# Patient Record
Sex: Male | Born: 1970 | Race: Black or African American | Hispanic: No | Marital: Married | Smoking: Never smoker
Health system: Southern US, Community
[De-identification: ages and names within clinical notes are randomized; demographics above are authoritative.]

## PROBLEM LIST (undated history)

## (undated) DIAGNOSIS — S2239XA Fracture of one rib, unspecified side, initial encounter for closed fracture: Secondary | ICD-10-CM

## (undated) DIAGNOSIS — Z8669 Personal history of other diseases of the nervous system and sense organs: Secondary | ICD-10-CM

## (undated) DIAGNOSIS — I10 Essential (primary) hypertension: Secondary | ICD-10-CM

## (undated) HISTORY — DX: Fracture of one rib, unspecified side, initial encounter for closed fracture: S22.39XA

---

## 2009-04-25 ENCOUNTER — Ambulatory Visit: Payer: Self-pay | Admitting: Family Medicine

## 2009-04-25 DIAGNOSIS — G43909 Migraine, unspecified, not intractable, without status migrainosus: Secondary | ICD-10-CM | POA: Insufficient documentation

## 2009-05-12 ENCOUNTER — Ambulatory Visit: Payer: Self-pay | Admitting: Family Medicine

## 2009-05-12 ENCOUNTER — Encounter: Payer: Self-pay | Admitting: Family Medicine

## 2009-05-12 LAB — CONVERTED CEMR LAB
Cholesterol: 165 mg/dL (ref 0–200)
HDL: 45 mg/dL (ref >39)
LDL Cholesterol: 99 mg/dL (ref 0–99)
PSA: 0.79 ng/mL (ref 0.10–4.00)
VLDL: 21 mg/dL (ref 0–40)

## 2009-05-30 ENCOUNTER — Ambulatory Visit: Payer: Self-pay | Admitting: Family Medicine

## 2009-06-19 ENCOUNTER — Ambulatory Visit: Payer: Self-pay | Admitting: Family Medicine

## 2009-06-27 ENCOUNTER — Encounter: Payer: Self-pay | Admitting: Family Medicine

## 2009-10-20 ENCOUNTER — Encounter: Payer: Self-pay | Admitting: Family Medicine

## 2010-01-28 ENCOUNTER — Encounter: Payer: Self-pay | Admitting: Family Medicine

## 2010-01-28 ENCOUNTER — Ambulatory Visit: Payer: Self-pay | Admitting: Family Medicine

## 2010-01-28 DIAGNOSIS — M545 Low back pain: Secondary | ICD-10-CM

## 2010-01-28 DIAGNOSIS — R252 Cramp and spasm: Secondary | ICD-10-CM

## 2010-01-28 LAB — CONVERTED CEMR LAB
Calcium: 10 mg/dL (ref 8.4–10.5)
Chloride: 105 meq/L (ref 96–112)

## 2010-02-04 ENCOUNTER — Emergency Department (HOSPITAL_COMMUNITY)
Admission: EM | Admit: 2010-02-04 | Discharge: 2010-02-04 | Payer: Self-pay | Source: Home / Self Care | Admitting: Emergency Medicine

## 2010-02-18 ENCOUNTER — Ambulatory Visit: Admission: RE | Admit: 2010-02-18 | Discharge: 2010-02-18 | Payer: Self-pay | Source: Home / Self Care

## 2010-02-18 DIAGNOSIS — S2239XA Fracture of one rib, unspecified side, initial encounter for closed fracture: Secondary | ICD-10-CM

## 2010-02-18 HISTORY — DX: Fracture of one rib, unspecified side, initial encounter for closed fracture: S22.39XA

## 2010-02-23 ENCOUNTER — Encounter: Payer: Self-pay | Admitting: Family Medicine

## 2010-02-23 ENCOUNTER — Ambulatory Visit (HOSPITAL_COMMUNITY)
Admission: RE | Admit: 2010-02-23 | Discharge: 2010-02-23 | Payer: Self-pay | Source: Home / Self Care | Attending: Family Medicine | Admitting: Family Medicine

## 2010-02-24 ENCOUNTER — Telehealth: Payer: Self-pay | Admitting: Family Medicine

## 2010-03-19 ENCOUNTER — Ambulatory Visit: Admission: RE | Admit: 2010-03-19 | Discharge: 2010-03-19 | Payer: Self-pay | Source: Home / Self Care

## 2010-03-24 NOTE — Miscellaneous (Signed)
Summary: pharmacy questioning dose  Clinical Lists Changes the health dept pharmacist , Diane, called to question the maintainence dose of propanolol. states 2 tabs 4 times a day is higher than her reference states. she ask that we look at this & call her back [phone].Golden Circle RN  Jun 27, 2009 12:08 PM Discussed dosage w/ pharmacist at [facility] health department. Rx filled.

## 2010-03-24 NOTE — Assessment & Plan Note (Signed)
Summary: FU/KH   Vital Signs:  Patient profile:   40 year old male Weight:      210 pounds Pulse rate:   70 / minute BP sitting:   129 / 88  (left arm)  Vitals Entered By: Arlyss Repress CMA, (June 19, 2009 2:16 PM) CC: f/up migraine headaches. Is Patient Diabetic? No Pain Assessment Patient in pain? no        Primary Care Provider:  Doree Albee MD  CC:  f/up migraine headaches..  History of Present Illness: 40 YOM w/ PMHx/o migraine headaches here  for folowup visit for migraine headaches. Pt recently seen in clinic for acute migraine. Refusing toradol/phenrgan shot ; agreeable to flexeril/benadryl for HA rescue.  Pt reports HA sxs 3-4 days/week w/ symptoms specific for nausea and photophobia w/ pedominant occipital HA. Pt denies aura w/ HA. Pt reports minimal relief w/ flexeril/benadryl. Pt states HAs affecting ability to find work. Pt states that imitrex has nad no effect for breakthrough HA.   Habits & Providers  Alcohol-Tobacco-Diet     Tobacco Status: quit  Allergies: No Known Drug Allergies  Social History: Smoking Status:  quit  Physical Exam  General:  alert.   Head:  normocephalic and atraumatic.   Eyes:  vision grossly intact. + sensitivity to bright light bilaterally Neck:  supple and full ROM, no nuchal rigidity Neurologic:  cranial nerves II-XII intact and strength normal in all extremities.     Impression & Recommendations:  Problem # 1:  MIGRAINE HEADACHE (ICD-346.90) Pt currently meets criteria for chronic migraine  based on Revised International Headache Society criteria for chronic migraine. Plan to begin the pt on prophylactic treatment w/ 1st line therapy w/ propanolol. Plan to reassess effectiveness of propranolol in 4 weeks. Pt educated that treatment will not completely resolve migraines, but rather 50% chance to decrease episodes and intensity over time.  His updated medication list for this problem includes:    Sumatriptan Succinate 50 Mg  Tabs (Sumatriptan succinate) .Marland KitchenMarland KitchenMarland KitchenMarland Kitchen 50 mg by mouth at onset of migraine, may repeat in 2 hours with maximum of 4 tablets in 24 hours for migraine pain    Propranolol Hcl 40 Mg Tabs (Propranolol hcl) .Marland Kitchen... 1 tab twice daily for 7 days, then increase to 2 tabs twice daily for 7 days; then 2 pills 4 times daily thereafter  Orders: [facility]- Est Level  3 (10258)  Complete Medication List: 1)  Sumatriptan Succinate 50 Mg Tabs (Sumatriptan succinate) .... 50 mg by mouth at onset of migraine, may repeat in 2 hours with maximum of 4 tablets in 24 hours for migraine pain 2)  Protonix 40 Mg Tbec (Pantoprazole sodium) .Marland Kitchen.. 1 tab by mouth once daily 3)  Flexeril 10 Mg Tabs (Cyclobenzaprine hcl) .... One by mouth two times a day as needed headache at start to abort 4)  Diphenhydramine Hcl 25 Mg Caps (Diphenhydramine hcl) .... One by mouth q 8 hours as needed headache to sleep it off 5)  Propranolol Hcl 40 Mg Tabs (Propranolol hcl) .Marland Kitchen.. 1 tab twice daily for 7 days, then increase to 2 tabs twice daily for 7 days; then 2 pills 4 times daily thereafter Prescriptions: PROPRANOLOL HCL 40 MG TABS (PROPRANOLOL HCL) 1 tab twice daily for 7 days, then increase to 2 tabs twice daily for 7 days; then 2 pills 4 times daily thereafter  #240 x 1   Entered and Authorized by:   Doree Albee MD   Signed by:   Doree Albee MD on  06/19/2009   Method used:   Faxed to ...       [facility] (retail)       [address] [place], [place]  [address]       Ph: [phone]       Fax: [phone]   RxID:   [id number]    Prevention & Chronic Care Immunizations   Influenza vaccine: Not documented    Tetanus booster: Not documented    Pneumococcal vaccine: Not documented  Other Screening   Smoking status: quit  (06/19/2009)  Lipids   Total Cholesterol: 165  (05/12/2009)   Lipid panel action/deferral: Lipid Panel ordered   LDL: 99  (05/12/2009)   LDL Direct: Not documented   HDL: 45   (05/12/2009)   Triglycerides: 107  (05/12/2009)

## 2010-03-24 NOTE — Assessment & Plan Note (Signed)
Summary: migraines,tcb   Vital Signs:  Patient profile:   40 year old male Height:      70 inches Weight:      214.6 pounds BMI:     30.90 Temp:     98.0 degrees F oral Pulse rate:   63 / minute BP sitting:   131 / 82  (left arm) Cuff size:   large  Vitals Entered By: Tessie Fass CMA (May 30, 2009 9:57 AM) CC: migraines Is Patient Diabetic? No Pain Assessment Patient in pain? yes     Location: head Intensity: 5   Primary Care Provider:  Doree Albee MD  CC:  migraines.  History of Present Illness: CC: migraine x 3 days  3 d h/o occipital HA, worse with laying flat.  No radiation.  Throbbing pain, 5/10.  + photophobia/phonophobia and nausea/vomiting.  Has dx of migraines in past.  Different because longer than normal.  Normally gets 2-3 headaches/wk.  Standing makes him feel better.  On sumatriptan but doesn't really notice improvement with it.  Has taken 2-3 in last 3 days.  + left eye tearing.  Affecting sleep - poor sleep and wakes up with headache.  Ex smoker, occasional wine.  Has tried excedrin, ibuprofen but doesn't really help.  Doesn't drink coffee, sodas.    Habits & Providers  Alcohol-Tobacco-Diet     Tobacco Status: never  Current Medications (verified): 1)  Sumatriptan Succinate 50 Mg Tabs (Sumatriptan Succinate) .... 50 Mg By Mouth At Onset of Migraine, May Repeat in 2 Hours With Maximum of 4 Tablets in 24 Hours For Migraine Pain 2)  Protonix 40 Mg Tbec (Pantoprazole Sodium) .Marland Kitchen.. 1 Tab By Mouth Once Daily  Allergies (verified): No Known Drug Allergies  Social History: Smoking Status:  never  Physical Exam  General:  alert, well-developed, and well-nourished.   Head:  normocephalic and atraumatic.   Eyes:  No corneal or conjunctival inflammation noted. EOMI. Perrla.  Nose:  no external deformity.   Mouth:  good dentition.   Neck:  supple and full ROM.   Extremities:  No clubbing, cyanosis, edema, or deformity noted with normal full range of  motion of all joints.   Neurologic:  Station and gait are normal. Sensory, motor and coordinative functions appear intact.  uvula midline.  CN 2-12 intact.  grimaces to shining light in eyes   Impression & Recommendations:  Problem # 1:  MIGRAINE HEADACHE (ICD-346.90)  toradol/phenergan shot today --> REFUSED.  Start flexeril/benadryl for headache rescue, return to PCP with HA diary and to discuss possilibity of migraine prophylaxis medication (consider amitriptyline).  Discussed lifestyle changes including dietary to help prevent migraines.  His updated medication list for this problem includes:    Sumatriptan Succinate 50 Mg Tabs (Sumatriptan succinate) .Marland KitchenMarland KitchenMarland KitchenMarland Kitchen 50 mg by mouth at onset of migraine, may repeat in 2 hours with maximum of 4 tablets in 24 hours for migraine pain  Orders: [facility]- Est  Level 4 ([address])  Complete Medication List: 1)  Sumatriptan Succinate 50 Mg Tabs (Sumatriptan succinate) .... 50 mg by mouth at onset of migraine, may repeat in 2 hours with maximum of 4 tablets in 24 hours for migraine pain 2)  Protonix 40 Mg Tbec (Pantoprazole sodium) .Marland Kitchen.. 1 tab by mouth once daily 3)  Flexeril 10 Mg Tabs (Cyclobenzaprine hcl) .... One by mouth two times a day as needed headache at start to abort 4)  Diphenhydramine Hcl 25 Mg Caps (Diphenhydramine hcl) .... One by mouth q 8 hours  as needed headache to sleep it off  Patient Instructions: 1)  Shot of toradol and phenergan today. 2)  Stop sumatriptan. 3)  Start Flexeril and Benadryl for migraine abortion. 4)  Return to see Dr. Alvester Morin to discuss migraine prevention medication. 5)  Keep a headache diary for next visit. 6)  Call clinic with questions.  Hope you feel better. 7)  If headache is not improving as expected, or if vomiting worsens, or if confusion, please return to be seen. Prescriptions: DIPHENHYDRAMINE HCL 25 MG CAPS (DIPHENHYDRAMINE HCL) one by mouth q 8 hours as needed headache to sleep it off  #30 x 0   Entered and  Authorized by:   Eustaquio Boyden  MD   Signed by:   Eustaquio Boyden  MD on 05/30/2009   Method used:   Print then Give to Patient   RxID:   [id number] FLEXERIL 10 MG TABS (CYCLOBENZAPRINE HCL) one by mouth two times a day as needed headache at start to abort  #30 x 0   Entered and Authorized by:   Eustaquio Boyden  MD   Signed by:   Eustaquio Boyden  MD on 05/30/2009   Method used:   Print then Give to Patient   RxID:   [id number]

## 2010-03-24 NOTE — Miscellaneous (Signed)
Summary: Dental Referral  Patient needs a dental referral for cleaning and checkup.  Scanned orange card into chart. Kyle Dominguez  October 20, 2009 11:41 AM   will forward to MD . Please enter referral.  Theresia Lo RN  October 20, 2009 11:57 AM ---------------------------------------------------------------------------------------------------------------------------------------  Dental referral entered. Doree Albee MD October 20, 2009 5:39 PM

## 2010-03-24 NOTE — Assessment & Plan Note (Signed)
Summary: NP,tcb   Vital Signs:  Patient profile:   40 year old male Height:      70 inches Weight:      214.6 pounds BMI:     30.90 Pulse rate:   85 / minute BP sitting:   131 / 84  (right arm)  Vitals Entered By: Arlyss Repress CMA, (April 25, 2009 2:59 PM) CC: new pt. no current problems. does not take meds. c/o headaches 2x per month with nausea and vomitting. last T-dap 2006 Is Patient Diabetic? No Pain Assessment Patient in pain? no        Primary Care Provider:  Doree Albee MD  CC:  new pt. no current problems. does not take meds. c/o headaches 2x per month with nausea and vomitting. last T-dap 2006.  History of Present Illness: 40 YO male w/ PMHx/o recurrent headaches ass'd w/ photophobia and nausea here for new pt visit. Pt and family recently relocated from [facility]. Pt reports having recurrent headaches for greater than 1o years. Pt describes headaches as occipital and frontal in nature photophobia and usual emesis w/in 2-3 hours of onset. Pt reports alleviating factors as sleep and being in dark room. Excedrin use w/ minimal improvement per pt.Pt denies any aggravating factors. Pt w/o formal dx of migraines in past. Pt inprocess of applying for [facility].   Habits & Providers  Alcohol-Tobacco-Diet     Tobacco Status: current     Tobacco Counseling: to quit use of tobacco products  Comments: smokes twice a month or less  Social History: Smoking Status:  current  Physical Exam  General:  alert, well-developed, and well-nourished.   Head:  normocephalic and atraumatic.   Eyes:  vision grossly intact.   Ears:  R ear normal and L ear normal.   Nose:  no external deformity.   Mouth:  good dentition.   Neck:  supple and full ROM.   Lungs:  normal respiratory effort.   Heart:  normal rate, regular rhythm, no murmur, and no gallop.   Abdomen:  soft and non-tender.     Impression & Recommendations:  Problem # 1:  MIGRAINE HEADACHE (ICD-346.90) Pt  placed on prophylactic medication for likely migraine headache pain. Plan for full/formal H&P pending establishement of [facility]. Case/[facility] process discussed w/ clinical staff.  His updated medication list for this problem includes:    Sumatriptan Succinate 50 Mg Tabs (Sumatriptan succinate) .Marland KitchenMarland KitchenMarland KitchenMarland Kitchen 50 mg by mouth at onset of migraine, may repeat in 2 hours with maximum of 4 tablets in 24 hours for migraine pain  Complete Medication List: 1)  Sumatriptan Succinate 50 Mg Tabs (Sumatriptan succinate) .... 50 mg by mouth at onset of migraine, may repeat in 2 hours with maximum of 4 tablets in 24 hours for migraine pain 2)  Protonix 40 Mg Tbec (Pantoprazole sodium) .Marland Kitchen.. 1 tab by mouth once daily  Other Orders: T-Lipid Profile ([id number]) [facility] ([id number]) Prescriptions: PROTONIX 40 MG TBEC (PANTOPRAZOLE SODIUM) 1 tab by mouth once daily  #30 x 3   Entered and Authorized by:   Doree Albee MD   Signed by:   Doree Albee MD on 04/25/2009   Method used:   Print then Give to Patient   RxID:   [id number] SUMATRIPTAN SUCCINATE 50 MG TABS (SUMATRIPTAN SUCCINATE) 50 mg by mouth at onset of migraine, may repeat in 2 hours with maximum of 4 tablets in 24 hours for migraine pain  #10 x 3   Entered and Authorized  by:   Doree Albee MD   Signed by:   Doree Albee MD on 04/25/2009   Method used:   Print then Give to Patient   RxID:   [id number] PROTONIX 40 MG TBEC (PANTOPRAZOLE SODIUM) 1 tab by mouth once daily  #30 x 3   Entered and Authorized by:   Doree Albee MD   Signed by:   Doree Albee MD on 04/25/2009   Method used:   Print then Give to Patient   RxID:   [id number] SUMATRIPTAN SUCCINATE 50 MG TABS (SUMATRIPTAN SUCCINATE) 50 mg by mouth at onset of migraine, may repeat in 2 hours with maximum of 4 tablets in 24 hours for migraine pain  #10 x 3   Entered and Authorized by:   Doree Albee MD   Signed by:   Doree Albee MD on 04/25/2009   Method used:   Print  then Give to Patient   RxID:   [id number]    Prevention & Chronic Care Immunizations   Influenza vaccine: Not documented    Tetanus booster: Not documented    Pneumococcal vaccine: Not documented  Other Screening   Smoking status: current  (04/25/2009)   Smoking cessation counseling: yes  (04/25/2009)  Lipids   Total Cholesterol: Not documented   Lipid panel action/deferral: Lipid Panel ordered   LDL: Not documented   LDL Direct: Not documented   HDL: Not documented   Triglycerides: Not documented

## 2010-03-24 NOTE — Assessment & Plan Note (Signed)
Summary: kh   Vital Signs:  Patient profile:   40 year old male Height:      70 inches Weight:      200 pounds BMI:     28.80 Pulse rate:   71 / minute BP sitting:   124 / 76  (left arm)  Vitals Entered By: Tessie Fass CMA (January 28, 2010 3:54 PM) CC: F/U, Back Pain, Headache Pain Assessment Patient in pain? yes     Location: lower back Intensity: 7   Primary Care Provider:  Doree Albee MD  CC:  F/U, Back Pain, and Headache.  History of Present Illness: Hand Cramps: Has been present for 1-2 months. Intermittent in nature. No LE cramping. No hx/o vascular disease, diabetes. Has been drinking 3-4 gatorades per day while working.    Back Pain History:      The patient's back pain has been present for > 6 weeks.  The pain is located in the lower back region and does not radiate below the knees.  He states this is not work related.  On a scale of 1-10, he describes the pain as a 7.  He states that he has had a prior history of back pain.  The patient has not had any recent physical therapy for his back pain.  The following makes the back pain better: rest .  The following makes the back pain worse: bending forward .        Other comments:  Pain uncontrolled with excedrine back. .    Critical Exclusionary Diagnosis Criteria (CEDC) for Back Pain:      The patient gives a history of previous trauma.  He has no prior history of spinal surgery.  There are no symptoms to suggest infection, cancer, cauda equina, or psychosocial factors for back pain.    Headache HPI:      The patient comes in for chronic management of stable headaches.  He has approximately 1 headaches per month.         Headache Treatment History:      He has tried beta blockers which were effective.      Allergies: No Known Drug Allergies  Physical Exam  General:  alert and well-developed.   Eyes:  vision grossly intact, pupils equal, pupils round, and pupils reactive to light.   Ears:  R ear normal  and L ear normal.   Nose:  no external deformity.   Mouth:  good dentition.   Neck:  supple and full ROM.   Lungs:  CTAB, no wheezes, rales, rhoncii Heart:  RRR, no rubs, gallops, murmurs  Headache Neurologic Exam:    Cranial Nerves Intact:  Yes    Focal Neurologic Deficit:  No   Detailed Back/Spine Exam  Gait:    Normal heel-toe gait pattern bilaterally.    Inspection:    normal  Palpation:    + tenderness to palpation along  L4-L5 region bilaterally   Vascular:    normal peripheral pulses   Lumbosacral Exam:  Palpation-spinal tenderness:  Abnormal    Location:  L4-L5 Squatting:  normal     No pain w/ hip flexion, minimal pain w/ hip extension, + pain w/ lateral rotation  + Pain w/ R hip external rotation   Impression & Recommendations:  Problem # 1:  LOW BACK PAIN, CHRONIC (ICD-724.2) Assessment New Will check x-rays given hx/o herniated disks. Will also refer to PT.  Will give vicodin for nighttime pain. Prn ultram for daytime pain.  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 Encouraged heavy by mouth fluid intake given vigorous physical activity. Will also check BMET for electrolyte abnormality.  Orders: Basic Met-[facility] ([address]) [facility]- Est  Level 4 ([address])  Problem # 3:  MIGRAINE HEADACHE (ICD-346.90)  Well controlled. Will continue propranol. No recent episodes of HA.  His updated medication list for this problem includes:    Sumatriptan Succinate 50 Mg Tabs (Sumatriptan succinate) .Marland KitchenMarland KitchenMarland KitchenMarland Kitchen 50 mg by mouth at onset of migraine, may repeat in 2 hours with maximum of 4 tablets in 24 hours for migraine pain    Propranolol Hcl 40 Mg Tabs (Propranolol hcl) .Marland Kitchen... 1 tab twice daily for 7 days, then increase to 2 tabs twice daily for 7 days; then 3 pills twice daily thereafter    Ultram 50 Mg Tabs (Tramadol hcl) .Marland Kitchen... 1 tab q 6-8  hours as needed pain  Orders: [facility]- Est  Level 4 ([address])  Complete Medication List: 1)  Sumatriptan Succinate 50 Mg Tabs (Sumatriptan succinate) .... 50 mg by mouth at onset of migraine, may repeat in 2 hours with maximum of 4 tablets in 24 hours for migraine pain 2)  Protonix 40 Mg Tbec (Pantoprazole sodium) .Marland Kitchen.. 1 tab by mouth once daily 3)  Flexeril 10 Mg Tabs (Cyclobenzaprine hcl) .... One by mouth two times a day as needed headache at start to abort 4)  Diphenhydramine Hcl 25 Mg Caps (Diphenhydramine hcl) .... One by mouth q 8 hours as needed headache to sleep it off 5)  Propranolol Hcl 40 Mg Tabs (Propranolol hcl) .Marland Kitchen.. 1 tab twice daily for 7 days, then increase to 2 tabs twice daily for 7 days; then 3 pills twice daily thereafter 6)  Ultram 50 Mg Tabs (Tramadol hcl) .Marland Kitchen.. 1 tab q 6-8 hours as needed pain  Other Orders: Radiology other (Radiology Other)  Patient Instructions: 1)  It was good to see you today 2)  I will get some xrays of your back given your history of herniated disks 3)  I will start you on some vicodin at night for back pain; I will also prescribe some ultram for pain 4)  You will also being referred to physical therapy to help with your back 5)  I will also check some labs for your arm cramping 6)  Followup in 6 weeks.  7)  Call with any questions 8)  God Bless and Merry Christmas 9)  Doree Albee MD  Prescriptions: Janean Sark 50 MG TABS (TRAMADOL HCL) 1 tab q 6-8 hours as needed pain  #60 x 0   Entered and Authorized by:   Doree Albee MD   Signed by:   Doree Albee MD on 01/29/2010   Method used:   Historical   RxID:   [id number] PROPRANOLOL HCL 40 MG TABS (PROPRANOLOL HCL) 1 tab twice daily for 7 days, then increase to 2 tabs twice daily for 7 days; then 3 pills twice daily thereafter  #180 x 6   Entered and Authorized by:   Doree Albee MD   Signed by:   Doree Albee MD on 01/29/2010   Method used:   Faxed to ...       [facility]  Department (retail)       [address] [place], [place]  [address]       Ph: [phone]  Fax: [phone]   RxID:   [id number]    Orders Added: 1)  Radiology other [Radiology Other] 2)  Basic Met-[facility] [[address]-78469] 3)  Physical Therapy Referral [PT] 4)  [facility]- Est  Level 4 [[address]52]     Prevention & Chronic Care Immunizations   Influenza vaccine: Not documented    Tetanus booster: Not documented    Pneumococcal vaccine: Not documented  Other Screening   Smoking status: quit  (06/19/2009)  Lipids   Total Cholesterol: 165  (05/12/2009)   Lipid panel action/deferral: Lipid Panel ordered   LDL: 99  (05/12/2009)   LDL Direct: Not documented   HDL: 45  (05/12/2009)   Triglycerides: 107  (05/12/2009)   Nursing Instructions: Give Flu vaccine today

## 2010-03-26 NOTE — Assessment & Plan Note (Signed)
Summary: migraine/eo   Vital Signs:  Patient profile:   40 year old male Height:      70 inches Weight:      212.6 pounds BMI:     30.62 Temp:     98.3 degrees F oral Pulse rate:   73 / minute BP sitting:   134 / 99  (right arm) Cuff size:   large  Vitals Entered By: Jimmy Footman, CMA (March 19, 2010 8:40 AM) CC: migranes x4 days Is Patient Diabetic? No   Primary Care Provider:  Doree Albee MD  CC:  migranes x4 days.  History of Present Illness: 4 days of typical migraine. Unknown trigger. has long hx migraines. Photophobia worst component. Had not had a migraine for 2 months. Other than photophobia and headache, mild nausea, no symkptoms. No focal deficits noted. Mentating OK.  has tried imitrex which was not particularly helpful, also propranolol which he is not on now. Not on any meds reg now--has tried otc ibuprofen for this headache. Denies illicit substance abuse.  Habits & Providers  Alcohol-Tobacco-Diet     Tobacco Status: never  Current Medications (verified): 1)  Protonix 40 Mg Tbec (Pantoprazole Sodium) .Marland Kitchen.. 1 Tab By Mouth Once Daily 2)  Ultram 50 Mg Tabs (Tramadol Hcl) .Marland Kitchen.. 1 Tab Q 6-8 Hours As Needed Pain  Allergies (verified): No Known Drug Allergies  Physical Exam  General:  alert, well-developed, well-nourished, and well-hydrated.   Eyes:  photophobic PERRLA Neck:  supple.   Neurologic:  alert & oriented X3, strength normal in all extremities, and gait normal.   Psych:  Oriented X3, normally interactive, good eye contact, not anxious appearing, and not depressed appearing.     Impression & Recommendations:  Problem # 1:  MIGRAINE HEADACHE (ICD-346.90)  tramodol as in pt instructions Orders: [facility]- Est Level  3 (11914)  Complete Medication List: 1)  Protonix 40 Mg Tbec (Pantoprazole sodium) .Marland Kitchen.. 1 tab by mouth once daily 2)  Ultram 50 Mg Tabs (Tramadol hcl) .Marland Kitchen.. 1 tab q 6-8 hours as needed pain [facility]- Est Level  3 (78295)  Patient  Instructions: 1)  I am giving you ten  tramodol which will hopefully help you with this migraine. Please make an appointment on your way out to see Dr Alvester Morin in the next couple of weeks for migraine follow  up. It is important that you keep this appointment as you have No SHOWED three visits in the last year.  If you are unable to keep a scheduled appouintment please call and reschedule 24 hours in advance. Prescriptions: ULTRAM 50 MG TABS (TRAMADOL HCL) 1 tab q 6-8 hours as needed pain  #10 x 0   Entered and Authorized by:   Denny Levy MD   Signed by:   Denny Levy MD on 03/19/2010   Method used:   Print then Give to Patient   RxID:   [id number]    Orders Added: 1)  [facility]- Est Level  3 [[id number]]     Impression & Recommendations:  The following medications were removed from the medication list:    Sumatriptan Succinate 50 Mg Tabs (Sumatriptan succinate) .Marland KitchenMarland KitchenMarland KitchenMarland Kitchen 50 mg by mouth at onset of migraine, may repeat in 2 hours with maximum of 4 tablets in 24 hours for migraine pain    Propranolol Hcl 40 Mg Tabs (Propranolol hcl) .Marland Kitchen... 1 tab twice daily for 7 days, then increase to 2 tabs twice daily for 7 days; then 3 pills twice daily thereafter  His updated medication  list for this problem includes:    Ultram 50 Mg Tabs (Tramadol hcl) .Marland Kitchen... 1 tab q 6-8 hours as needed pain   Orders: [facility]- Est Level  3 (99213)    Complete Medication List: 1)  Protonix 40 Mg Tbec (Pantoprazole sodium) .Marland Kitchen.. 1 tab by mouth once daily 2)  Ultram 50 Mg Tabs (Tramadol hcl) .Marland Kitchen.. 1 tab q 6-8 hours as needed pain

## 2010-03-26 NOTE — Letter (Signed)
Summary: Generic Letter  [facility]  [address]   [place], [place] [address]   Phone: [phone]  Fax: [phone]    02/23/2010  [facility] [address] [place], [place]  [address]  Dear Mr. MIGUES,  I wanted to inform you that you back x-rays are negative for any fractures or other abnormalities. If you have any other questions, please feel free to give [place] a call.       Sincerely,   Doree Albee MD

## 2010-03-26 NOTE — Assessment & Plan Note (Signed)
Summary: hurt ribs,df   Vital Signs:  Patient profile:   40 year old male Height:      70 inches Weight:      206.8 pounds BMI:     29.78 Temp:     98.6 degrees F oral Pulse rate:   69 / minute BP sitting:   145 / 96  (left arm) Cuff size:   large  Vitals Entered By: Jimmy Footman, CMA (February 18, 2010 3:27 PM) CC: broken  ribs Is Patient Diabetic? No Pain Assessment Patient in pain? yes        Primary Care Provider:  Doree Albee MD  CC:  broken  ribs.  History of Present Illness: Fall: Pt reports falling from ladder at work 12/14. Then brought to [facility] by family after driving self home from incident. Pt noted to have fracture of L 4th, 5th, and 6th rib.  Was given rx for percocet for pain. Was then referred to [facility] by [facility] Comp representative with pt being placed on light work restrictions from 12/22-12/29 with subsequent followup on 12/30 per pt. Pt reports significant pain since incident. Significant pain with deep breathing, lateral movement, general L sided movement. Percocet helping minimally with pain. Pain most significant at night.   Back Pain: Has worsened somewhat since incident. Is able to ambulate and bend. No radicular type pain per pt. pt did not get x-ray as previously ordered because of work schedule. Vicodin did help somewaht with pain. Was having to use during the day for pain. Ultram also moderately effective. Feels that percocet has been more effective in pain managment.   Habits & Providers  Alcohol-Tobacco-Diet     Tobacco Status: never  Allergies: No Known Drug Allergies  Social History: Smoking Status:  never  Physical Exam  General:  alert, minimal distress Lungs:  CTAB,  + tenderness to palpation along L anterolateral rib cage along 3rd-6th rib space. No evident superficial rib bruising.  Heart:  RRR Abdomen:  S/NT/+bowel sounds  Msk:  Strength and ROM WNL in distal LE bilaterally, + tenderness to palpation in lumbar region  bilaterally-unchanged from previous.   Extremities:  2+ peripheral pulses.    Impression & Recommendations:  Problem # 1:  FRACTURE, RIB, LEFT (ICD-807.00) Case precepted with Dr. Jennette Kettle. Pt instructed to followup with [facility] physician at [facility] assigned to case given nature of accident (work related incident). Tentatiely scheduled for followup on 12/30 per pt.  Orders: [facility]- Est Level  3 (99213)  Problem # 2:  LOW BACK PAIN, CHRONIC (ICD-724.2) Will reorder L spine x-rays.  Instructed to use ultram as needed for pain. As well as use of percocet as this seems to be more effective for pain.  His updated medication list for this problem includes:    Flexeril 10 Mg Tabs (Cyclobenzaprine hcl) ..... One by mouth two times a day as needed headache at start to abort    Ultram 50 Mg Tabs (Tramadol hcl) .Marland Kitchen... 1 tab q 6-8 hours as needed pain  Orders: Radiology other (Radiology Other)  Complete Medication List: 1)  Sumatriptan Succinate 50 Mg Tabs (Sumatriptan succinate) .... 50 mg by mouth at onset of migraine, may repeat in 2 hours with maximum of 4 tablets in 24 hours for migraine pain 2)  Protonix 40 Mg Tbec (Pantoprazole sodium) .Marland Kitchen.. 1 tab by mouth once daily 3)  Flexeril 10 Mg Tabs (Cyclobenzaprine hcl) .... One by mouth two times a day as needed headache at start to abort 4)  Diphenhydramine Hcl 25 Mg Caps (Diphenhydramine hcl) .... One by mouth q 8 hours as needed headache to sleep it off 5)  Propranolol Hcl 40 Mg Tabs (Propranolol hcl) .Marland Kitchen.. 1 tab twice daily for 7 days, then increase to 2 tabs twice daily for 7 days; then 3 pills twice daily thereafter 6)  Ultram 50 Mg Tabs (Tramadol hcl) .Marland Kitchen.. 1 tab q 6-8 hours as needed pain   Orders Added: 1)  Radiology other [Radiology Other] 2)  [facility]- Est Level  3 [[id number]]     Prevention & Chronic Care Immunizations   Influenza vaccine: Not documented    Tetanus booster: Not documented    Pneumococcal vaccine: Not documented  Other  Screening   Smoking status: never  (02/18/2010)  Lipids   Total Cholesterol: 165  (05/12/2009)   Lipid panel action/deferral: Lipid Panel ordered   LDL: 99  (05/12/2009)   LDL Direct: Not documented   HDL: 45  (05/12/2009)   Triglycerides: 107  (05/12/2009)

## 2010-03-26 NOTE — Progress Notes (Signed)
Summary: xray  Phone Note Call from Patient Call back at [phone]   Summary of Call: pt given xray results, said hes had slipped disc in the past, wants to know if xrays would have shown this? Initial call taken by: Knox Royalty,  February 24, 2010 4:51 PM  Follow-up for Phone Call        Called and spoke to pt about results. Says that he has seen a lawyer about rib/ack pain. Was referred to orthopedist. Has scheduled MRI for evaluation hx/o slipped disk. Told pt that MRI would better evaluate slipped disk and orthopeadist should be able to furhter work up back pain. Told pt that if there were any other concerns or questions, to give clinic a call. Pt agreeable to plan.  Doree Albee MD March 06, 2010 6:09 PM

## 2010-04-06 ENCOUNTER — Encounter: Payer: Self-pay | Admitting: Family Medicine

## 2010-04-06 ENCOUNTER — Ambulatory Visit (INDEPENDENT_AMBULATORY_CARE_PROVIDER_SITE_OTHER): Payer: Self-pay | Admitting: Family Medicine

## 2010-04-06 VITALS — BP 126/86 | HR 72 | Ht 70.0 in | Wt 214.0 lb

## 2010-04-06 DIAGNOSIS — Z13220 Encounter for screening for lipoid disorders: Secondary | ICD-10-CM

## 2010-04-06 DIAGNOSIS — M545 Low back pain, unspecified: Secondary | ICD-10-CM

## 2010-04-06 DIAGNOSIS — S2239XA Fracture of one rib, unspecified side, initial encounter for closed fracture: Secondary | ICD-10-CM

## 2010-04-06 DIAGNOSIS — G43909 Migraine, unspecified, not intractable, without status migrainosus: Secondary | ICD-10-CM

## 2010-04-06 LAB — LIPID PANEL
Cholesterol: 179 mg/dL (ref 0–200)
HDL: 45 mg/dL (ref >39)
LDL Cholesterol: 106 mg/dL — ABNORMAL HIGH (ref 0–99)
Triglycerides: 138 mg/dL (ref <150)
VLDL: 28 mg/dL (ref 0–40)

## 2010-04-06 NOTE — Patient Instructions (Signed)
Herniated Disk   Disks are the soft tissue cushions that are found between the bones of the spine. When a disk herniates, a part of the material inside the disk pushes out and may cause pressure against the nerves near the spine. In the neck this results in pain in the neck, shoulder, and down the arm. In the lower back a ruptured disk causes pain in the back, buttock, and down the back of leg (sciatica). Numbness and weakness in the arms or legs may also be signs of a ruptured disk.   Disk herniation is very common. Only a small number of patients with disk problems need more treatment than bed rest and pain medicine for relief. Pressures on the disks of the lower back are much higher in the sitting position. It is important that you avoid prolonged sitting until the pain and back spasms improve.  Lying on your side is the preferred position.   You should rest or sleep on a firm mattress (use a sheet of plywood under the mattress if necessary).  Water beds do not provide enough support. You should avoid bending, lifting, or any other activity that increases your pain.  Traction applied to the neck or back may help reduce symptoms. Special braces may also be beneficial.   When your pain improves, you should resume normal activity gradually. Take periods of rest throughout the day.   Only take over-the-counter or prescription medicines for pain, discomfort, or fever as directed by your caregiver. Cold packs applied for 30 minutes every 2-3 hours may also help relieve pain or spasms.   Exercises to strengthen your back and abdominal muscles and to improve your fitness may be prescribed as symptoms improve.   Call your doctor for a follow-up appointment as recommended.    SEEK IMMEDIATE MEDICAL CARE IF:   You have very severe pain, increased weakness or numbness, or lose control of your bladder or bowels.   Document Released: 03/18/2004  Document Re-Released: 12/06/2008 [facility] Patient Information  2011 [place], [place].Migraine Headache   A migraine headache is an intense, throbbing pain on one or both sides of your head. The exact cause of a migraine headache is not always known. A migraine may be caused when nerves in the brain become irritated and release chemicals that cause swelling (inflammation) within blood vessels, causing pain. Many migraine sufferers have a family history of migraines. Before you get a migraine you may or may not get an aura. An aura is a group of symptoms that can predict the beginning of a migraine. An aura may include:  Visual changes such as: l Flashing lights. l Seeing bright spots or zig-zag lines.  l Tunnel vision.   Feelings of numbness.   Trouble talking.  Muscle weakness.    SYMPTOMS OF A MIGRAINE  A migraine headache has one or more of the following symptoms: l Pain on one or both sides of your head. l Pain that is pulsating or throbbing in nature. l Pain that is severe enough to prevent daily activities. l Pain that is aggravated by any daily physical activity. l Nausea (feeling sick to your stomach), vomiting or both.  l Pain with exposure to bright lights, loud noises or activity. l General sensitivity to bright lights or loud noises.   MIGRAINE TRIGGERS A migraine headache can be triggered by many things. Examples of triggers include:   l Alcohol. l Smoking. l Stress.  l It may be related to menses (male menstruation). l Aged  cheeses. l Foods or drinks that contain nitrates, glutamate, aspartame or tyramine.  l Lack of sleep.  l Chocolate.  l Caffeine. l Hunger. l Medications such as nitroglycerine (used to treat chest pain), birth control pills, estrogen and some blood pressure medications.   DIAGNOSIS  A migraine headache is often diagnosed based on: l Your symptoms.  l Physical examination. l A CT scan of your head may be ordered to see if your headaches are caused from other medical conditions.    HOME CARE  INSTRUCTIONS  Medications can help prevent migraines if they are recurrent or should they become recurrent. Your caregiver can help you with a medication or treatment program that will be helpful to you.   If you get a migraine, it may be helpful to lie down in a dark, quiet room.   It may be helpful to keep a headache diary. This may help you find a trend as to what may be triggering your headaches.    SEEK IMMEDIATE MEDICAL CARE IF:   You do not get relief from the medications given to you or you have a recurrence of pain.  You have confusion, personality changes or seizures.   You have headaches that wake you from sleep.   You have an increased frequency in your headaches.   You have a stiff neck.  You have a loss of vision.  You have muscle weakness.   You start losing your balance or have trouble walking.  You feel faint or pass out.   MAKE SURE YOU:   Understand these instructions.   Will watch your condition.  Will get help right away if you are not doing well or get worse.   Document Released: 02/08/2005  Document Re-Released: 12/06/2008 [facility] Patient Information 2011 [place], [place].

## 2010-04-06 NOTE — Progress Notes (Signed)
  Subjective:    Patient ID: Kyle Dominguez, male    DOB: [birth year], 40 y.o.   MRN: [id number]  Headache  This is a chronic problem. The current episode started more than 1 year ago. The problem occurs intermittently. Progression since onset: improved. The pain is located in the frontal and bilateral region. The pain does not radiate. Associated symptoms include nausea, photophobia and weakness. Pertinent negatives include no abdominal pain, facial sweating, numbness or scalp tenderness. The symptoms are aggravated by fatigue (Stress).   Pt is being seen here for follow up of migraine flare. Was seen 1/26 with typical migraine symptoms. Was given rx for tramadol. Pt also used imitrex during flare. Pt reports resolution of migraine flare since visit. Has been using propranolol 2-3 times per week instead of daily.     Review of Systems  Eyes: Positive for photophobia.  Gastrointestinal: Positive for nausea. Negative for abdominal pain.  Neurological: Positive for weakness and headaches. Negative for numbness.  Note: + ROS associated with migraine flares.      Objective:   Physical Exam  Constitutional: He is oriented to person, place, and time. He appears well-developed.  HENT:  Head: Normocephalic and atraumatic.  Eyes: Pupils are equal, round, and reactive to light.  Neck: Normal range of motion. Neck supple.  Cardiovascular: Normal rate and regular rhythm.   Pulmonary/Chest: Effort normal and breath sounds normal.  Abdominal: Soft. Bowel sounds are normal.  Musculoskeletal: Normal range of motion.  Neurological: He is alert and oriented to person, place, and time.  Psychiatric: He has a normal mood and affect.          Assessment & Plan:

## 2010-04-06 NOTE — Assessment & Plan Note (Addendum)
Migraine flare from 1/26 currently resolved. Pt instructed to use at least once daily propranolol for migraine prophylaxis. Still has left over imitrex for use in case of flare recurrence.  In review of possible triggers from most recent flare. Pt feels it may have been associated for stressors from current workers comp claim for accidental fall at work. Discussed red flags for return. Pt/wife agreeable to plan.

## 2010-04-06 NOTE — Assessment & Plan Note (Addendum)
Pt with noted partial disk herniation (non operable) per pt report. Has been seeing workman's comp approved MD for most of back and rib pain issues. Pt starts physical therapy today per report.  Pain currently well controlled with percocet (did not bring in physical rx) and prn tramadol. Does not need refills. Will follow up prn.

## 2010-04-07 DIAGNOSIS — Z13220 Encounter for screening for lipoid disorders: Secondary | ICD-10-CM | POA: Insufficient documentation

## 2010-04-07 MED ORDER — PROPRANOLOL HCL 40 MG PO TABS: 40.0000 mg | ORAL_TABLET | Freq: Two times a day (BID) | ORAL | Status: DC

## 2010-04-07 MED ORDER — OXYCODONE-ACETAMINOPHEN 7.5-325 MG PO TABS: 1.0000 | ORAL_TABLET | ORAL | Status: AC | PRN

## 2010-04-07 NOTE — Assessment & Plan Note (Signed)
Will check FLP today in line with preventative screening.

## 2010-04-20 ENCOUNTER — Telehealth: Payer: Self-pay | Admitting: Family Medicine

## 2010-04-20 NOTE — Telephone Encounter (Signed)
Pt wants to speak with RN about having the flu shot, has been sick several times after having it.

## 2010-04-20 NOTE — Telephone Encounter (Signed)
Had flu shot last month. States he has been sick ever since. States he has had 4 episodes of illnesses since.

## 2010-04-20 NOTE — Telephone Encounter (Signed)
Discussed possibility of allergies as we have had a warm winter & tree pollen is high already. If he has head cold symptoms most of the past month, best to see md. Explained that flu vaccine protects against the 3-4 worst strains, but not all, so it is possible he had the flu. His symptoms did not meet usual flu criteria. He will make an appt to see md about his concerns. States he eats a good diet & has never been sick so much in one month before.Kyle Dominguez

## 2010-04-22 ENCOUNTER — Encounter: Payer: Self-pay | Admitting: Family Medicine

## 2010-04-22 ENCOUNTER — Ambulatory Visit: Payer: Self-pay | Admitting: Family Medicine

## 2010-04-29 ENCOUNTER — Encounter: Payer: Self-pay | Admitting: Family Medicine

## 2010-04-29 ENCOUNTER — Ambulatory Visit (INDEPENDENT_AMBULATORY_CARE_PROVIDER_SITE_OTHER): Payer: Self-pay | Admitting: Family Medicine

## 2010-04-29 DIAGNOSIS — J309 Allergic rhinitis, unspecified: Secondary | ICD-10-CM

## 2010-04-29 MED ORDER — FLUTICASONE PROPIONATE 50 MCG/ACT NA SUSP: 1.0000 | Freq: Every day | NASAL | Status: DC

## 2010-04-29 MED ORDER — CETIRIZINE HCL 10 MG PO TABS: 10.0000 mg | ORAL_TABLET | Freq: Every day | ORAL | Status: DC

## 2010-04-29 NOTE — Patient Instructions (Signed)
Allergic Rhinitis Allergic rhinitis is when the mucous membranes in the nose respond to allergens. Allergens are particles in the air that cause your body to have an allergic reaction. This causes you to release allergic antibodies. Through a chain of events, these eventually cause you to release histamine into the blood stream (hence the use of antihistamines). Although meant to be protective to the body, it is this release that causes your discomfort, such as frequent sneezing, congestion and an itchy runny nose.  CAUSES The pollen allergens may come from grasses, trees, and weeds. This is seasonal allergic rhinitis, or "hay fever." Other allergens cause year-round allergic rhinitis (perennial allergic rhinitis) such as house dust mite allergen, pet dander and mold spores.  SYMPTOMS  Nasal stuffiness (congestion).   Runny, itchy nose with sneezing and tearing of the eyes.   There is often an itching of the mouth, eyes and ears.  It cannot be cured, but it can be controlled with medications. DIAGNOSIS If you are unable to determine the offending allergen, skin or blood testing may find it. TREATMENT  Avoid the allergen.   Medications and allergy shots (immunotherapy) can help.   Hay fever may often be treated with antihistamines in pill or nasal spray forms. Antihistamines block the effects of histamine. There are over-the-counter medicines that may help with nasal congestion and swelling around the eyes. Check with your caregiver before taking or giving this medicine.  If the treatment above does not work, there are many new medications your caregiver can prescribe. Stronger medications may be used if initial measures are ineffective. Desensitizing injections can be used if medications and avoidance fails. Desensitization is when a patient is given ongoing shots until the body becomes less sensitive to the allergen. Make sure you follow up with your caregiver if problems continue. SEEK  MEDICAL CARE IF:   You develop fever (more than 100.5F (38.1 C).   You develop a cough that does not stop easily (persistent).   You have shortness of breath.   You start wheezing.   Symptoms interfere with normal daily activities.  Document Released: 11/03/2000 Document Re-Released: 03/02/2009 [employer] Patient Information 2011 [employer]. 

## 2010-04-30 DIAGNOSIS — J309 Allergic rhinitis, unspecified: Secondary | ICD-10-CM | POA: Insufficient documentation

## 2010-04-30 NOTE — Assessment & Plan Note (Signed)
Overall symptoms consistent with allergic rhinitis vs. Post viral syndrome. I think there is a post viral component, however given family history and hx/o sxs in previous spring seasons, will start on trial  flonase and zyrtec.

## 2010-04-30 NOTE — Progress Notes (Signed)
  Subjective:    Patient ID: Kyle Dominguez, male    DOB: 11-Jun-1970, 39 y.o.   MRN: [id number]  HPI Pt with recent viral URI/pharyngitis that resolved 1-2 weeks per pt. Pt now complaining of nasal congestion, rhinorrhea, intermittent eye irritation. No fevers, trouble swallowing, rashes. Pt feels that sxs may be related to allergies. Pt states that he had similar sxs last year during spring time. Pt also with children and family members with allergic disease. Pt has not tried anti-histamine for sxs in the past.    Review of Systems     Objective:   Physical Exam  Constitutional: He appears well-developed.  HENT:       TMs clear bilaterally + nasal erythema + post oropharyngeal eythema and mucosal drainage.   Neck: Normal range of motion. Neck supple.  Cardiovascular: Normal rate and regular rhythm.   Pulmonary/Chest: Effort normal and breath sounds normal.  Lymphadenopathy:    He has no cervical adenopathy.          Assessment & Plan:

## 2010-05-17 ENCOUNTER — Inpatient Hospital Stay (INDEPENDENT_AMBULATORY_CARE_PROVIDER_SITE_OTHER)
Admission: RE | Admit: 2010-05-17 | Discharge: 2010-05-17 | Disposition: A | Discharge status: Home / Self Care | Payer: Self-pay | Source: Ambulatory Visit | Attending: Family Medicine | Admitting: Family Medicine

## 2010-05-17 DIAGNOSIS — H6980 Other specified disorders of Eustachian tube, unspecified ear: Secondary | ICD-10-CM

## 2010-05-20 ENCOUNTER — Ambulatory Visit: Payer: Self-pay | Admitting: Family Medicine

## 2010-05-22 ENCOUNTER — Ambulatory Visit (INDEPENDENT_AMBULATORY_CARE_PROVIDER_SITE_OTHER): Payer: Self-pay | Admitting: Family Medicine

## 2010-05-22 ENCOUNTER — Encounter: Payer: Self-pay | Admitting: Family Medicine

## 2010-05-22 VITALS — BP 132/75 | HR 82 | Temp 98.3°F | Ht 72.0 in | Wt 210.0 lb

## 2010-05-22 DIAGNOSIS — H66019 Acute suppurative otitis media with spontaneous rupture of ear drum, unspecified ear: Secondary | ICD-10-CM | POA: Insufficient documentation

## 2010-05-22 MED ORDER — OFLOXACIN 0.3 % OT SOLN: 10.0000 [drp] | Freq: Two times a day (BID) | OTIC | Status: AC

## 2010-05-22 MED ORDER — CIPROFLOXACIN HCL 0.3 % OP SOLN: OPHTHALMIC | Status: DC

## 2010-05-22 MED ORDER — AMOXICILLIN 500 MG PO CAPS: 500.0000 mg | ORAL_CAPSULE | Freq: Three times a day (TID) | ORAL | Status: AC

## 2010-05-22 NOTE — Assessment & Plan Note (Addendum)
Amoxilcillin 500 tid x 10 days, + ofloxacin 10 drops in left ear bid x 14 days.  Follow-up in 4-6 weeks.  If hearing loss or non-healing of TM, will refer to ENT. Given precautions to avoid water in ear.  Could not afford ear drops, now off the formulary so substituted Cipro op drops 4 drop in left ear bid x 10 days

## 2010-05-22 NOTE — Patient Instructions (Signed)
Do not get water in your ear, no underwater swimming, careful in the shower.   Make appointment for recheck in 4-6 weeks to recheck your hearing and the healing of your eardrum. Make appointment if worsening fever, pain, or drainage.

## 2010-05-22 NOTE — Progress Notes (Signed)
  Subjective:    Patient ID: Kyle Dominguez, male    DOB: Jan 01, 1971, 40 y.o.   MRN: [id number]  HPI    Review of Systems     Objective:   Physical Exam  Constitutional: He appears well-developed and well-nourished. No distress.  HENT:  Head: Normocephalic and atraumatic.  Nose: Nose normal.  Mouth/Throat: Oropharynx is clear and moist. No oropharyngeal exudate.       Left TM ruptured with purulence.  Right TM erythematous.    Eyes: Conjunctivae are normal. Pupils are equal, round, and reactive to light.  Neck: Neck supple. No thyromegaly present.  Cardiovascular: Normal rate and regular rhythm.   No murmur heard. Pulmonary/Chest: Effort normal and breath sounds normal. He has no wheezes. He has no rales.  Lymphadenopathy:    He has no cervical adenopathy.          Assessment & Plan:

## 2010-05-22 NOTE — Progress Notes (Addendum)
  Subjective:    Patient ID: Kyle Dominguez, male    DOB: 1970-07-10, 40 y.o.   MRN: [id number]  HPI EAR PAIN  Location: left   Onset: 1 week   Symptoms  Sensation of fullness: no Ear discharge: yes URI symptoms: no  Fever: no    Tinnitus: yes Dizziness: no Hearing loss: yes Headache: yes Toothache: no Jaw click: no Rashes or lesions: no Facial muscle weakness: no  Red Flags Recent trauma: no PMH recurrent OM: no  PMH prior ear surgery: no  Tubes currently: Neither  Recent antibiotic usage (last 30 days):  Yes, used son's amoxicillin for 3 days last week Diabetes or Immunosuppresion:  no    Review of Systems see HPI     Objective:   Physical Exam        Assessment & Plan:

## 2010-05-22 NOTE — Progress Notes (Signed)
Addended by: Delbert Harness on: 05/22/2010 01:51 PM   Modules accepted: Orders

## 2010-06-03 ENCOUNTER — Telehealth: Payer: Self-pay | Admitting: Family Medicine

## 2010-06-03 NOTE — Telephone Encounter (Signed)
Pt need to discuss some issues with you

## 2010-06-03 NOTE — Telephone Encounter (Signed)
Called and left VM for pt. Told pt that if there was anything significant to that he needed to talk about,  please call back. Otherwise set up an appt to be seen in the coming weeks.

## 2010-06-17 ENCOUNTER — Encounter: Payer: Self-pay | Admitting: Family Medicine

## 2010-06-17 ENCOUNTER — Ambulatory Visit (INDEPENDENT_AMBULATORY_CARE_PROVIDER_SITE_OTHER): Payer: Self-pay | Admitting: Family Medicine

## 2010-06-17 VITALS — BP 149/100 | HR 86 | Temp 98.3°F | Ht 72.0 in | Wt 219.8 lb

## 2010-06-17 DIAGNOSIS — M545 Low back pain, unspecified: Secondary | ICD-10-CM

## 2010-06-17 DIAGNOSIS — H66019 Acute suppurative otitis media with spontaneous rupture of ear drum, unspecified ear: Secondary | ICD-10-CM

## 2010-06-17 DIAGNOSIS — M549 Dorsalgia, unspecified: Secondary | ICD-10-CM

## 2010-06-17 DIAGNOSIS — I10 Essential (primary) hypertension: Secondary | ICD-10-CM | POA: Insufficient documentation

## 2010-06-17 DIAGNOSIS — R030 Elevated blood-pressure reading, without diagnosis of hypertension: Secondary | ICD-10-CM

## 2010-06-17 MED ORDER — HYDROCODONE-ACETAMINOPHEN 5-500 MG PO TABS: 1.0000 | ORAL_TABLET | Freq: Four times a day (QID) | ORAL | Status: DC | PRN

## 2010-06-17 MED ORDER — TRAMADOL HCL 50 MG PO TABS: 50.0000 mg | ORAL_TABLET | Freq: Four times a day (QID) | ORAL | Status: DC | PRN

## 2010-06-17 NOTE — Progress Notes (Signed)
  Subjective:    Patient ID: Kyle Dominguez, male    DOB: 04-Oct-1970, 40 y.o.   MRN: [id number]  HPI Pt here for follow up on purulent otitis media:  Otitis- Pt seen 05/22/10 for purulent OM w/ TM rupture. Placed on otic and po abx (amox po and cipro per ear). Overall much improved s/p abx course per pt. Pain resolved. Hearing now back at baseline.   Back Pain- Pt reports recurrence of LBP x 2-3 days. Has hx/o chronic LBP. Was performing strenuous activity. Feels that he may have re-aggravated back.  Has been using tylenol and ultram with minimal improvement in pain. Pain currently 8/10.  Pt reports no longer seeing workman's comp for rib/back pain in setting of accidental fall at work as case is now under litigation.  Review of Systems See HPI     Objective:   Physical Exam Gen: up in chair, NAD HEENT: NCAT, EOMI, R TM WNL; L TM-midline scar on middle of TM; well healed  ABD: S/NT/+ bowel sounds  MSK- + TTP along lumbar region of back, + mild pain with back flexion and extension   Assessment & Plan:  Otitis- Resolved. Will follow prn.  Back Pain- Toradol shot refused. Short term course of vicodin given. Ultram also refilled. Pt instructed to use ultram on scheduled basis with vicodin use on prn basis. Will follow up in 2 weeks.  Elevated BP- likely secondary to pain. 1 noted elevated BP in system prior to this(145/96 01/2010). Will recheck at follow up visit. If elevated in absence, would consider further assessment for hypertension.

## 2010-06-17 NOTE — Assessment & Plan Note (Signed)
Toradol shot refused. Short term course of vicodin given. Ultram also refilled. Pt instructed to use ultram on scheduled basis with vicodin use on prn basis. Will follow up in 2 weeks.

## 2010-06-17 NOTE — Assessment & Plan Note (Signed)
likely secondary to pain. 1 noted elevated BP in system prior to this(145/96 01/2010). Will recheck at follow up visit. If elevated in absence, would consider further assessment for hypertension.

## 2010-06-17 NOTE — Assessment & Plan Note (Signed)
Resolved. Will follow prn.

## 2010-06-17 NOTE — Patient Instructions (Signed)
Back Pain (Lumbosacral Strain) Back pain is one of the most common causes of pain. There are many causes of back pain. Most are not serious conditions.  CAUSES Your backbone (spinal column) is made up of 24 main vertebral bodies, the sacrum, and the coccyx. These are held together by muscles and tough, fibrous tissue (ligaments). Nerve roots pass through the openings between the vertebrae. A sudden move or injury to the back may cause injury to, or pressure on, these nerves. This may result in localized back pain or pain movement (radiation) into the buttocks, down the leg, and into the foot. Sharp, shooting pain from the buttock down the back of the leg (sciatica) is frequently associated with a ruptured (herniated) disc. Pain may be caused by muscle spasm alone. Your caregiver can often find the cause of your pain by the details of your symptoms and an exam. In some cases, you may need tests (such as X-rays). Your caregiver will work with you to decide if any tests are needed based on your specific exam. HOME CARE INSTRUCTIONS  Avoid an underactive lifestyle. Active exercise, as directed by your caregiver, is your greatest weapon against back pain.   Avoid hard physical activities (tennis, racquetball, water-skiing) if you are not in proper physical condition for it. This may aggravate and/or create problems.   If you have a back problem, avoid sports requiring sudden body movements. Swimming and walking are generally safer activities.   Maintain good posture.   Avoid becoming overweight (obese).   Use bed rest for only the most extreme, sudden (acute) episode. Your caregiver will help you determine how much bed rest is necessary.   For acute conditions, you may put ice on the injured area.   Put ice in a plastic bag.   Place a towel between your skin and the bag.   Leave the ice on for 30 minutes at a time, every 2 hours, or as needed.   After you are improved and more active, it may  help to apply heat for 30 minutes before activities.  See your caregiver if you are having pain that lasts longer than expected. Your caregiver can advise appropriate exercises and/or therapy if needed. With conditioning, most back problems can be avoided. SEEK IMMEDIATE MEDICAL CARE IF:  You have numbness, tingling, weakness, or problems with the use of your arms or legs.   You experience severe back pain not relieved with medicines.   There is a change in bowel or bladder control.   You have increasing pain in any area of the body, including your belly (abdomen).   You notice shortness of breath, dizziness, or feel faint.   You feel sick to your stomach (nauseous), are throwing up (vomiting), or become sweaty.   You notice discoloration of your toes or legs, or your feet get very cold.   Your back pain is getting worse.   You have an oral temperature above 100.5, not controlled by medicine.  MAKE SURE YOU:   Understand these instructions.   Will watch your condition.   Will get help right away if you are not doing well or get worse.  Document Released: 11/18/2004 Document Re-Released: 05/05/2009 [facility] Patient Information 2011 [place], [place].

## 2010-06-22 ENCOUNTER — Ambulatory Visit: Payer: Self-pay | Admitting: Family Medicine

## 2010-07-13 ENCOUNTER — Ambulatory Visit: Payer: Self-pay | Admitting: Family Medicine

## 2010-09-19 ENCOUNTER — Encounter: Payer: Self-pay | Admitting: Family Medicine

## 2010-10-23 ENCOUNTER — Ambulatory Visit (INDEPENDENT_AMBULATORY_CARE_PROVIDER_SITE_OTHER): Payer: Self-pay | Admitting: Family Medicine

## 2010-10-23 VITALS — BP 128/85 | HR 73 | Temp 98.2°F | Wt 206.6 lb

## 2010-10-23 DIAGNOSIS — F419 Anxiety disorder, unspecified: Secondary | ICD-10-CM

## 2010-10-23 DIAGNOSIS — F329 Major depressive disorder, single episode, unspecified: Secondary | ICD-10-CM | POA: Insufficient documentation

## 2010-10-23 DIAGNOSIS — F341 Dysthymic disorder: Secondary | ICD-10-CM

## 2010-10-23 DIAGNOSIS — M545 Low back pain: Secondary | ICD-10-CM

## 2010-10-23 DIAGNOSIS — M549 Dorsalgia, unspecified: Secondary | ICD-10-CM

## 2010-10-23 MED ORDER — BUPROPION HCL 75 MG PO TABS: 75.0000 mg | ORAL_TABLET | Freq: Three times a day (TID) | ORAL | Status: DC

## 2010-10-23 MED ORDER — HYDROCODONE-ACETAMINOPHEN 5-500 MG PO TABS: 1.0000 | ORAL_TABLET | Freq: Four times a day (QID) | ORAL | Status: AC | PRN

## 2010-10-23 NOTE — Patient Instructions (Signed)
It was good to see you today  I am starting you on wellbutrin for your mood I have given you a refill of vicodin for your back; I have also sent you back to physical therapy to help with back strengthening, Come back to see me in 2 weeks to follow up on your mood and your back Call with any other questions, God Bless, Doree Albee MD

## 2010-10-23 NOTE — Assessment & Plan Note (Signed)
Acute flare 2/2 likely strain in setting of moving. Will give short course of vicodin. Will re-refer to PT. Will follow up in 2 weeks.  If this is a recurrent issue, will consider non narcotic medication for treatment including neurontin/nortriptyline.

## 2010-10-23 NOTE — Progress Notes (Signed)
  Subjective:    Patient ID: Kyle Dominguez, male    DOB: 11/11/1970, 40 y.o.   MRN: [id number]  HPI Pt is here for an acute visit:  Back Pain: Pt hs a longstanding hx/o lumbar pain. Recently had lumbar strain in setting of work related low back and rib injury. Pt states that he reinjured low back in process of moving over the weekend. Pt was previously in process of having low back pain and rib ain managed as a workman's compensation issue. This case has been resolved per pt. Pt had tramadol rx at home that has been minimally effective.  Mood: Pt reports worsening in mood. Has been out of work for several months because of work injury. Pt reports feelings of anxiety and difficulty sleeping since pt lost job. No HI/SI. But pt has had feelings of depressed mood. Pt also reports some sexual dysfunction. Has had some difficulty maintaining erections. Still intermittent nocturnal erections.     Review of Systems See HPI    Objective:   Physical Exam Gen: up in chair, NAD HEENT: NCAT, EOMI, TMs clear bilaterally CV: RRR, no murmurs auscultated PULM: CTAB, no wheezes, rales, rhoncii ABD: S/NT/+ bowel sounds  EXT: 2+ peripheral pulses   MSK: Mild lumbar TTP  Assessment & Plan:

## 2010-10-23 NOTE — Assessment & Plan Note (Signed)
PHQ- 9 score of 16. Will start on wellbutrin as this has less sexual side effects. Would like to do more a more thorough assessment of sexual dysfunction once mood is stabilized. Will follow up in 2-4 weeks.

## 2010-11-06 ENCOUNTER — Ambulatory Visit (INDEPENDENT_AMBULATORY_CARE_PROVIDER_SITE_OTHER): Payer: Self-pay | Admitting: Family Medicine

## 2010-11-06 VITALS — BP 149/88 | HR 65 | Temp 98.2°F | Ht 72.0 in | Wt 207.0 lb

## 2010-11-06 DIAGNOSIS — F529 Unspecified sexual dysfunction not due to a substance or known physiological condition: Secondary | ICD-10-CM

## 2010-11-06 DIAGNOSIS — R030 Elevated blood-pressure reading, without diagnosis of hypertension: Secondary | ICD-10-CM

## 2010-11-06 DIAGNOSIS — N529 Male erectile dysfunction, unspecified: Secondary | ICD-10-CM

## 2010-11-06 DIAGNOSIS — F419 Anxiety disorder, unspecified: Secondary | ICD-10-CM

## 2010-11-06 DIAGNOSIS — N539 Unspecified male sexual dysfunction: Secondary | ICD-10-CM

## 2010-11-06 DIAGNOSIS — F341 Dysthymic disorder: Secondary | ICD-10-CM

## 2010-11-06 LAB — COMPREHENSIVE METABOLIC PANEL
Albumin: 4.5 g/dL (ref 3.5–5.2)
Alkaline Phosphatase: 99 U/L (ref 39–117)
Creat: 1.08 mg/dL (ref 0.50–1.35)
Glucose, Bld: 87 mg/dL (ref 70–99)
Potassium: 4.1 mEq/L (ref 3.5–5.3)
Total Bilirubin: 0.8 mg/dL (ref 0.3–1.2)

## 2010-11-06 LAB — TSH: TSH: 1.073 u[IU]/mL (ref 0.350–4.500)

## 2010-11-06 MED ORDER — BUPROPION HCL 75 MG PO TABS: 75.0000 mg | ORAL_TABLET | Freq: Three times a day (TID) | ORAL | Status: DC

## 2010-11-06 NOTE — Patient Instructions (Signed)
It was good to see you  Take the wellbutrin as prescribed I am checking some blood work.  I will contact you with the results if there normal  Come back to see me 2-3 weeks after you start the medication Call with any questions,

## 2010-11-09 LAB — TESTOSTERONE, FREE, TOTAL, SHBG
Testosterone, Free: 108.4 pg/mL (ref 47.0–244.0)
Testosterone: 482.81 ng/dL (ref 250–890)

## 2010-11-10 ENCOUNTER — Telehealth: Payer: Self-pay | Admitting: Family Medicine

## 2010-11-10 DIAGNOSIS — N539 Unspecified male sexual dysfunction: Secondary | ICD-10-CM | POA: Insufficient documentation

## 2010-11-10 NOTE — Assessment & Plan Note (Signed)
Instructed pt to resume wellbutrin. PHQ-9 score 8 today. Will follow up in 2 weeks. Told pt that rx should be available at [facility].

## 2010-11-10 NOTE — Telephone Encounter (Signed)
Called and spoke to pt about lab lab results, that they were normal. Encouraged starting of wellbutrin. Pt plans on picking up today. Pt states that he has an appt with me in October. Told pt to call if he had any other questions. Pt agreeable.

## 2010-11-10 NOTE — Telephone Encounter (Signed)
Please call Mr. Edelen back to discuss disability benefit

## 2010-11-10 NOTE — Assessment & Plan Note (Signed)
Relatively broad differential diagnosis. I doubt vascular issue given spontaneous erections. Will check TSH, testosterone, and albumin. Given baseline mood, there may be a psychogenic component to overall presentation. Pt is also noted to be on propranolol for migraines. Pt has been on this for > 1 year. I doubt if this is source of sxs but will consider transitioning away from this if sxs persist over next 2-3 months.

## 2010-11-10 NOTE — Progress Notes (Signed)
  Subjective:    Patient ID: Kyle Dominguez, male    DOB: [birth year], 40 y.o.   MRN: [id number]  HPI Pt here for follow up on mood and possible erectile dysfunction. Pt was previously prescribed wellbutrin at last clinical visit 10/05/10.  -Today, pt states that mood is somewhat improved. Pt has not yet filled wellbutrin rx. Pt states that pharmacy that rx was taken to did not have medication ([facility] and [facility] outpt pharmacy). Pt feels that there is major contribution of mood secondary to pt being out of work. Pt states that he is currently in the job search and feels more hopeful overall, but does admit still with mild depressed mood.  ? ED: Pt reports issues with sexual function over last 6 months. Pt denies any issues with this prior to this. Pt reports that he has generalized issues with fatigue. Has the ability to maintain erection. + spontaneous and am erections. No issues with ejaculation. Pt feels that overall stamina has decreased. Still has desire for sexual activity.    Review of Systems See HPI     Objective:   Physical Exam Gen: up in chair, NAD CV: RRR, no murmurs auscultated  PULM: CTAB, no wheezes, rales, rhoncii  ABD: S/NT/+ bowel sounds  EXT: 2+ peripheral pulses    Assessment & Plan:

## 2010-11-10 NOTE — Assessment & Plan Note (Signed)
Noted elevated BP today. Pt has not taken propranolol today. Pt denies high salt intake. Will reassess at next visit. If this elevated at next visit. Will likely start on HCTZ. Will check Cr and K today as part of sexual dysfunction work up.

## 2010-11-30 ENCOUNTER — Ambulatory Visit: Payer: Self-pay | Admitting: Family Medicine

## 2010-11-30 NOTE — Telephone Encounter (Signed)
Saw pt on 9/18 outside of clinic. Instructed pt to set up appt to discuss disability. Pt agreeable to this.

## 2010-12-01 ENCOUNTER — Encounter: Payer: Self-pay | Admitting: Family Medicine

## 2010-12-01 ENCOUNTER — Ambulatory Visit (INDEPENDENT_AMBULATORY_CARE_PROVIDER_SITE_OTHER): Payer: Self-pay | Admitting: Family Medicine

## 2010-12-01 VITALS — BP 151/99 | HR 65 | Ht 72.0 in | Wt 212.0 lb

## 2010-12-01 DIAGNOSIS — F329 Major depressive disorder, single episode, unspecified: Secondary | ICD-10-CM

## 2010-12-01 DIAGNOSIS — F341 Dysthymic disorder: Secondary | ICD-10-CM

## 2010-12-01 DIAGNOSIS — N539 Unspecified male sexual dysfunction: Secondary | ICD-10-CM

## 2010-12-01 DIAGNOSIS — F32A Depression, unspecified: Secondary | ICD-10-CM

## 2010-12-01 DIAGNOSIS — I10 Essential (primary) hypertension: Secondary | ICD-10-CM

## 2010-12-01 DIAGNOSIS — F529 Unspecified sexual dysfunction not due to a substance or known physiological condition: Secondary | ICD-10-CM

## 2010-12-01 DIAGNOSIS — G43909 Migraine, unspecified, not intractable, without status migrainosus: Secondary | ICD-10-CM

## 2010-12-01 MED ORDER — PROPRANOLOL HCL 40 MG PO TABS: ORAL_TABLET | ORAL | Status: DC

## 2010-12-01 NOTE — Patient Instructions (Signed)

## 2010-12-03 NOTE — Progress Notes (Addendum)
  Subjective:    Patient ID: Kyle Dominguez, male    DOB: [birth year], 40 y.o.   MRN: [id number]  HPI Pt is here for chronic problem follow up:  Mood: Pt previously rxd wellbutrin for mood in setting of complaints of depressed and concerns of sexual performance. Pt states that he has not filled this medication out of concern for cost.  Of note, labs including TSH, testosterone level, CMET all WNL.  Today, pt states that mood and sexual performance has improved dramatically since last clinical visit. Pt states that he has been working out regularly now and this has helped relieve stress. Sexual performance has also improved.   Elevated BP: Pt is noted to have had multiple elevated BPs >140/90. Pt denies any HA, CP, SOB. Pt has been on propranolol in the past for migraine ppx. Pt states that he has been using this intermittently. No HA,CP, SOB. No reports of fatigue with propranolol. No stimulant per pt.   Flu shot refused today    Review of Systems See HPI    Objective:   Physical Exam Gen: up in chair, NAD HEENT: NCAT, EOMI, TMs clear bilaterally CV: RRR, no murmurs auscultated PULM: CTAB, no wheezes, rales, rhoncii ABD: S/NT/+ bowel sounds  EXT: 2+ peripheral pulses   Assessment & Plan:

## 2010-12-03 NOTE — Assessment & Plan Note (Signed)
Elevated today. Discussed with pt importance of compliance with propranolol as this may serve as tx for HTN and migraine. Cr and K checked within the last 3 weeks. Discussed low salt diet. Will follow up in 1-2 weeks. Encouaged exercise. May consider addition of HCTZ if BP still elevated at follow up visit.

## 2010-12-03 NOTE — Assessment & Plan Note (Signed)
Improved with exercise. Has not used medication. Will continue to follow.

## 2010-12-03 NOTE — Assessment & Plan Note (Signed)
Symptomatically improved with exercise. Labs including testosterone WNL. Will continue to follow.

## 2010-12-10 ENCOUNTER — Ambulatory Visit: Payer: Self-pay | Admitting: Family Medicine

## 2010-12-16 NOTE — Telephone Encounter (Signed)
This was disccused with pt at last clinical visit. Pt is still in process of trying to claim disability. Will turn in forms to clinic when needed.

## 2011-04-30 ENCOUNTER — Emergency Department (INDEPENDENT_AMBULATORY_CARE_PROVIDER_SITE_OTHER)
Admission: EM | Admit: 2011-04-30 | Discharge: 2011-04-30 | Disposition: A | Discharge status: Home / Self Care | Payer: Self-pay | Source: Home / Self Care | Attending: Family Medicine | Admitting: Family Medicine

## 2011-04-30 ENCOUNTER — Encounter (HOSPITAL_COMMUNITY): Payer: Self-pay | Admitting: Cardiology

## 2011-04-30 DIAGNOSIS — S86119A Strain of other muscle(s) and tendon(s) of posterior muscle group at lower leg level, unspecified leg, initial encounter: Secondary | ICD-10-CM

## 2011-04-30 DIAGNOSIS — S86819A Strain of other muscle(s) and tendon(s) at lower leg level, unspecified leg, initial encounter: Secondary | ICD-10-CM

## 2011-04-30 HISTORY — DX: Personal history of other diseases of the nervous system and sense organs: Z86.69

## 2011-04-30 MED ORDER — TRAMADOL HCL 50 MG PO TABS: 50.0000 mg | ORAL_TABLET | Freq: Four times a day (QID) | ORAL | Status: DC | PRN

## 2011-04-30 NOTE — ED Notes (Signed)
Pt reports left calf pain shooting up behind left knee. This started 2 days ago after doing strengthening exercises. Left calf noted to be slightly larger than right. Denies fever. Pt tries to pull toes up and has pain as well as if he does a calf raise he has increased pain.

## 2011-04-30 NOTE — Discharge Instructions (Signed)
Wear boot for comfort as needed, activity guided by pain, use heat and pain medicine and gentle stretch to help pain and spasm. See orthopedist if further problems.

## 2011-04-30 NOTE — ED Provider Notes (Signed)
History     CSN: [id number]  Arrival date & time 04/30/11  4098   First MD Initiated Contact with Patient 04/30/11 1024      Chief Complaint  Patient presents with  . Leg Pain    (Consider location/radiation/quality/duration/timing/severity/associated sxs/prior treatment) Patient is a 41 y.o. male presenting with leg pain. The history is provided by the patient.  Leg Pain  The incident occurred 2 days ago. The incident occurred at the gym (doing calf exercises and felt sudden pop  in mid calf, hurts with stairs .). The pain is present in the right leg. The pain is moderate. Associated symptoms include muscle weakness. Pertinent negatives include no numbness and no tingling. The symptoms are aggravated by palpation and activity.    Past Medical History  Diagnosis Date  . Hx of migraines     History reviewed. No pertinent past surgical history.  No family history on file.  History  Substance Use Topics  . Smoking status: Never Smoker   . Smokeless tobacco: Never Used  . Alcohol Use: No      Review of Systems  Constitutional: Negative.   Musculoskeletal: Positive for gait problem.  Neurological: Negative for tingling and numbness.    Allergies  Review of patient's allergies indicates no known allergies.  Home Medications   Current Outpatient Rx  Name Route Sig Dispense Refill  . HYDROCODONE-ACETAMINOPHEN 5-325 MG PO TABS Oral Take 1 tablet by mouth every 6 (six) hours as needed.    Marland Kitchen CIPROFLOXACIN HCL 0.3 % OP SOLN  4 drops in left ear twice a day for 10 days 5 mL 0  . CYCLOBENZAPRINE HCL 10 MG PO TABS Oral Take 10 mg by mouth 2 (two) times daily as needed. For headache at start to abort     . DIPHENHYDRAMINE HCL 25 MG PO CAPS Oral Take 25 mg by mouth every 8 (eight) hours as needed. For headache to sleep it off     . PANTOPRAZOLE SODIUM 40 MG PO TBEC Oral Take 40 mg by mouth daily.      Marland Kitchen PROPRANOLOL HCL 40 MG PO TABS  2 tabs twice daily 120 tablet 3  .  SUMATRIPTAN SUCCINATE 50 MG PO TABS  50 mg by mouth at onset of migraine, may repeat in 2 hours with maximum of 4 tablets in 24 hours for migraine pain     . TRAMADOL HCL 50 MG PO TABS Oral Take 1 tablet (50 mg total) by mouth every 6 (six) hours as needed for pain. 1 tab q 6-8 hours as needed for pain 100 tablet 3  . TRAMADOL HCL 50 MG PO TABS Oral Take 1 tablet (50 mg total) by mouth every 6 (six) hours as needed for pain. 20 tablet 0    BP 140/91  Pulse 77  Temp(Src) 98.2 F (36.8 C) (Oral)  Resp 18  SpO2 99%  Physical Exam  Nursing note and vitals reviewed. Constitutional: He is oriented to person, place, and time. He appears well-developed and well-nourished.  HENT:  Head: Normocephalic.  Musculoskeletal: He exhibits tenderness.       Legs: Neurological: He is alert and oriented to person, place, and time.  Skin: Skin is warm and dry.    ED Course  Procedures (including critical care time)  Labs Reviewed - No data to display No results found.   1. Strain of soleus muscle       MDM         Linna Hoff,  MD 05/02/11 1629

## 2011-06-21 ENCOUNTER — Encounter: Payer: Self-pay | Admitting: Family Medicine

## 2011-08-13 ENCOUNTER — Ambulatory Visit (INDEPENDENT_AMBULATORY_CARE_PROVIDER_SITE_OTHER): Payer: Self-pay | Admitting: Family Medicine

## 2011-08-13 ENCOUNTER — Encounter: Payer: Self-pay | Admitting: Family Medicine

## 2011-08-13 VITALS — BP 135/83 | HR 68 | Temp 98.1°F | Ht 72.0 in | Wt 219.0 lb

## 2011-08-13 DIAGNOSIS — M545 Low back pain: Secondary | ICD-10-CM

## 2011-08-13 DIAGNOSIS — M549 Dorsalgia, unspecified: Secondary | ICD-10-CM

## 2011-08-13 MED ORDER — TRAMADOL HCL 50 MG PO TABS: 50.0000 mg | ORAL_TABLET | Freq: Four times a day (QID) | ORAL | Status: DC | PRN

## 2011-08-13 NOTE — Progress Notes (Signed)
  Subjective:    Patient ID: Kyle Dominguez, male    DOB: 07/31/1970, 41 y.o.   MRN: [id number]  HPI Pt presents today with chief complaint of LBP.  Baseline hx/o LBP s/p work related fall.  Pt states that he has been working out for general strengthening and has noticed LBP since this point.  Pt has been doing LE exercises including lunges and squats.  Pain predominantly in lumbar region.  No radicular sxs.  No weakness.   Review of Systems See HPI, otherwise ROS negative     Objective:   Physical Exam Gen: up in chair, NAD HEENT: NCAT, EOMI, TMs clear bilaterally CV: RRR, no murmurs auscultated PULM: CTAB, no wheezes, rales, rhoncii ABD: S/NT/+ bowel sounds  EXT: 2+ peripheral pulses MSK: + TTP in lumbar region, FABER negative    Assessment & Plan:

## 2011-08-13 NOTE — Assessment & Plan Note (Signed)
Mild flare 2/2 exercise. Discussed proper weight technique. RICE. NSAIDs. Will rx tramadol to help with breakthrough pain. Handout given.  Follow up as needed.

## 2011-08-13 NOTE — Patient Instructions (Signed)

## 2013-01-23 ENCOUNTER — Emergency Department (INDEPENDENT_AMBULATORY_CARE_PROVIDER_SITE_OTHER)
Admission: EM | Admit: 2013-01-23 | Discharge: 2013-01-23 | Disposition: A | Discharge status: Nursing Home (Includes ICF) | Payer: Managed Care, Other (non HMO) | Source: Home / Self Care | Attending: Emergency Medicine | Admitting: Emergency Medicine

## 2013-01-23 ENCOUNTER — Encounter (HOSPITAL_COMMUNITY): Payer: Self-pay | Admitting: Emergency Medicine

## 2013-01-23 ENCOUNTER — Emergency Department (HOSPITAL_COMMUNITY)
Admission: EM | Admit: 2013-01-23 | Discharge: 2013-01-24 | Disposition: A | Discharge status: Home / Self Care | Payer: Managed Care, Other (non HMO) | Attending: Emergency Medicine | Admitting: Emergency Medicine

## 2013-01-23 ENCOUNTER — Emergency Department (HOSPITAL_COMMUNITY): Payer: Managed Care, Other (non HMO)

## 2013-01-23 DIAGNOSIS — I10 Essential (primary) hypertension: Secondary | ICD-10-CM | POA: Insufficient documentation

## 2013-01-23 DIAGNOSIS — R079 Chest pain, unspecified: Secondary | ICD-10-CM

## 2013-01-23 DIAGNOSIS — I200 Unstable angina: Secondary | ICD-10-CM

## 2013-01-23 DIAGNOSIS — M79609 Pain in unspecified limb: Secondary | ICD-10-CM | POA: Insufficient documentation

## 2013-01-23 DIAGNOSIS — R05 Cough: Secondary | ICD-10-CM | POA: Insufficient documentation

## 2013-01-23 DIAGNOSIS — Z8679 Personal history of other diseases of the circulatory system: Secondary | ICD-10-CM | POA: Insufficient documentation

## 2013-01-23 DIAGNOSIS — R0789 Other chest pain: Secondary | ICD-10-CM | POA: Insufficient documentation

## 2013-01-23 DIAGNOSIS — R0602 Shortness of breath: Secondary | ICD-10-CM | POA: Insufficient documentation

## 2013-01-23 DIAGNOSIS — R059 Cough, unspecified: Secondary | ICD-10-CM | POA: Insufficient documentation

## 2013-01-23 DIAGNOSIS — Z79899 Other long term (current) drug therapy: Secondary | ICD-10-CM | POA: Insufficient documentation

## 2013-01-23 HISTORY — DX: Essential (primary) hypertension: I10

## 2013-01-23 LAB — CBC
HCT: 42.1 % (ref 39.0–52.0)
MCH: 27.8 pg (ref 26.0–34.0)
MCHC: 33.5 g/dL (ref 30.0–36.0)
MCV: 83 fL (ref 78.0–100.0)
Platelets: 209 10*3/uL (ref 150–400)
RBC: 5.07 MIL/uL (ref 4.22–5.81)
RDW: 13.4 % (ref 11.5–15.5)
WBC: 5.6 10*3/uL (ref 4.0–10.5)

## 2013-01-23 LAB — COMPREHENSIVE METABOLIC PANEL
ALT: 26 U/L (ref 0–53)
AST: 23 U/L (ref 0–37)
Alkaline Phosphatase: 95 U/L (ref 39–117)
BUN: 14 mg/dL (ref 6–23)
Calcium: 9 mg/dL (ref 8.4–10.5)
Chloride: 101 mEq/L (ref 96–112)
GFR calc Af Amer: >90 mL/min (ref >90)
Glucose, Bld: 84 mg/dL (ref 70–99)
Potassium: 4.1 mEq/L (ref 3.5–5.1)
Sodium: 137 mEq/L (ref 135–145)
Total Bilirubin: 0.4 mg/dL (ref 0.3–1.2)

## 2013-01-23 LAB — TROPONIN I: Troponin I: <0.3 ng/mL (ref <0.30)

## 2013-01-23 LAB — D-DIMER, QUANTITATIVE: D-Dimer, Quant: <0.27 ug/mL-FEU (ref 0.00–0.48)

## 2013-01-23 MED ORDER — IBUPROFEN 800 MG PO TABS: 800.0000 mg | ORAL_TABLET | Freq: Three times a day (TID) | ORAL | Status: DC

## 2013-01-23 MED ORDER — NITROGLYCERIN 0.4 MG SL SUBL
0.4000 mg | SUBLINGUAL_TABLET | SUBLINGUAL | Status: DC | PRN
  Administered 2013-01-23: 0.4 mg via SUBLINGUAL

## 2013-01-23 MED ORDER — ASPIRIN 81 MG PO CHEW
CHEWABLE_TABLET | ORAL | Status: AC
  Filled 2013-01-23: qty 4

## 2013-01-23 MED ORDER — SODIUM CHLORIDE 0.9 % IV SOLN
Freq: Once | INTRAVENOUS | Status: AC
  Administered 2013-01-23: 19:00:00 via INTRAVENOUS

## 2013-01-23 MED ORDER — NITROGLYCERIN 0.4 MG SL SUBL
SUBLINGUAL_TABLET | SUBLINGUAL | Status: AC
  Filled 2013-01-23: qty 25

## 2013-01-23 MED ORDER — ASPIRIN 81 MG PO CHEW
324.0000 mg | CHEWABLE_TABLET | Freq: Once | ORAL | Status: AC
  Administered 2013-01-23: 324 mg via ORAL

## 2013-01-23 NOTE — ED Notes (Signed)
Per EMS:Pt from [facility] with co central CP radiating to left arm x 5 days progressively worsening. Reports cough x 2 days; pain worse with inspiration. Denies N/V/diaphoresis. Given 325 aspirin, 1 nitro. Nitro relieved pain from 8/10 to 6/10. VSS. AO x4.

## 2013-01-23 NOTE — ED Notes (Signed)
[facility] called no truck available;  English as a second language teacher called.  Report called to [place]

## 2013-01-23 NOTE — ED Provider Notes (Signed)
Medical screening examination/treatment/procedure(s) were performed by non-physician practitioner and as supervising physician I was immediately available for consultation/collaboration.  Leslee Home, M.D.  Reuben Likes, MD 01/23/13 2221

## 2013-01-23 NOTE — ED Provider Notes (Signed)
CSN: [id number]     Arrival date & time 01/23/13  1818 History   First MD Initiated Contact with Patient 01/23/13 1838     Chief Complaint  Patient presents with  . Chest Pain   (Consider location/radiation/quality/duration/timing/severity/associated sxs/prior Treatment) Patient is a 42 y.o. male presenting with chest pain. The history is provided by the patient.  Chest Pain Pain location:  Substernal area Pain quality: pressure and sharp   Radiates to: left breast. Pain radiates to the back: no   Pain severity:  Moderate Duration:  5 days Progression:  Waxing and waning Chronicity:  New Context comment:  Increases with exertion, such as walking up steps Relieved by:  Rest Associated symptoms: cough and shortness of breath   Associated symptoms comment:  +Left calf pain x 1 week Risk factors: hypertension     Past Medical History  Diagnosis Date  . Hx of migraines   . Hypertension    History reviewed. No pertinent past surgical history. History reviewed. No pertinent family history. History  Substance Use Topics  . Smoking status: Never Smoker   . Smokeless tobacco: Never Used  . Alcohol Use: Yes    Review of Systems  Constitutional: Negative.   HENT: Negative.   Eyes: Negative.   Respiratory: Positive for cough and shortness of breath.   Cardiovascular: Positive for chest pain.  Gastrointestinal: Negative.   Endocrine: Negative for polydipsia, polyphagia and polyuria.  Genitourinary: Negative.   Musculoskeletal: Negative.   Skin: Negative.   Neurological: Negative.   Psychiatric/Behavioral: Negative.     Allergies  Review of patient's allergies indicates no known allergies.  Home Medications   Current Outpatient Rx  Name  Route  Sig  Dispense  Refill  . EXPIRED: cetirizine (ZYRTEC) 10 MG tablet   Oral   Take 1 tablet (10 mg total) by mouth daily.   30 tablet   2   . ciprofloxacin (CILOXAN) 0.3 % ophthalmic solution      4 drops in left ear twice a  day for 10 days   5 mL   0   . cyclobenzaprine (FLEXERIL) 10 MG tablet   Oral   Take 10 mg by mouth 2 (two) times daily as needed. For headache at start to abort          . diphenhydrAMINE (BENADRYL) 25 mg capsule   Oral   Take 25 mg by mouth every 8 (eight) hours as needed. For headache to sleep it off          . EXPIRED: fluticasone (FLONASE) 50 MCG/ACT nasal spray   Nasal   1 spray by Nasal route daily.   16 g   2   . HYDROcodone-acetaminophen (NORCO) 5-325 MG per tablet   Oral   Take 1 tablet by mouth every 6 (six) hours as needed.         . pantoprazole (PROTONIX) 40 MG tablet   Oral   Take 40 mg by mouth daily.           . propranolol (INDERAL) 40 MG tablet      2 tabs twice daily   120 tablet   3   . SUMAtriptan (IMITREX) 50 MG tablet      50 mg by mouth at onset of migraine, may repeat in 2 hours with maximum of 4 tablets in 24 hours for migraine pain          . EXPIRED: traMADol (ULTRAM) 50 MG tablet   Oral  Take 1 tablet (50 mg total) by mouth every 6 (six) hours as needed for pain.   20 tablet   0   . traMADol (ULTRAM) 50 MG tablet   Oral   Take 1 tablet (50 mg total) by mouth every 6 (six) hours as needed for pain. 1 tab q 6-8 hours as needed for pain   100 tablet   3    BP 158/100  Pulse 64  Temp(Src) 97.5 F (36.4 C) (Oral)  Resp 18  SpO2 99% Physical Exam  Nursing note and vitals reviewed. Constitutional: He is oriented to person, place, and time. He appears well-developed and well-nourished. No distress.  HENT:  Head: Normocephalic and atraumatic.  Eyes: Conjunctivae are normal. Pupils are equal, round, and reactive to light.  Neck: Normal range of motion. Neck supple. No JVD present.  Cardiovascular: Normal rate, regular rhythm and normal heart sounds.  Exam reveals no gallop and no friction rub.   No murmur heard. Pulmonary/Chest: Effort normal and breath sounds normal. No respiratory distress. He has no wheezes. He has no  rales.  Abdominal: Soft. Bowel sounds are normal. He exhibits no distension. There is no tenderness.  Musculoskeletal: Normal range of motion. He exhibits tenderness. He exhibits no edema.  +left medial calf tender with palpation  Neurological: He is alert and oriented to person, place, and time.  Skin: Skin is warm and dry. No rash noted. He is not diaphoretic.  Psychiatric: He has a normal mood and affect. His behavior is normal.    ED Course  Procedures (including critical care time) Labs Review Labs Reviewed - No data to display Imaging Review No results found.  EKG Interpretation    Date/Time:    Ventricular Rate:    PR Interval:    QRS Duration:   QT Interval:    QTC Calculation:   R Axis:     Text Interpretation:              MDM   1. Unstable angina pectoris    ECG @ 18:37: NSR with rightward axis. Inverted T in III. No dynamic ST/Twave changes. Case discussed with Dr. Lorenz Coaster. Patient given O2,  NTG, ASA and EMS contacted for transport to [facility] ER. Hemodynamically stable. Patient aware of transfer.     Jess Barters [place], [place] 01/23/13 1859

## 2013-01-23 NOTE — ED Notes (Signed)
C/o  Chest pain starting in center of chest and radiating across to left breast. sob  Deep cough. Denies any other symptoms.  Pain comes and goes. On set 4 days ago.  Hx of hypertension. Noncompliant.

## 2013-01-23 NOTE — ED Provider Notes (Signed)
CSN: [id number]     Arrival date & time 01/23/13  1920 History   First MD Initiated Contact with Patient 01/23/13 1927     Chief Complaint  Patient presents with  . Chest Pain  . Cough   (Consider location/radiation/quality/duration/timing/severity/associated sxs/prior Treatment) The history is provided by the patient and medical records. No language interpreter was used.    Kyle Dominguez is a 42 y.o. male  with a hx of HTN (uncontrolled) presents to the Emergency Department complaining of gradual, waxing and waning, progressively worsening substernal chest pain radiating to the L breast  Onset 2 days ago. Pt also c/o 2 days of dry cough.  Associated symptoms include cough, SOB.  Rest makes the SOB better and exertion (such as steps or inclines) or deep breaths make it worse; but CP is unchanged with exertion.  Pt also c/o of left calf pain for approx 1 week.  Pt denies hx of DVT, long travel, immobilization or recent surgery.  Pt denies fever, chills, headache, neck pain, neck stiffness, abd pain, nausea, vomiting, diarrhea, weakness, dizziness, syncope.       Past Medical History  Diagnosis Date  . Hx of migraines   . Hypertension    History reviewed. No pertinent past surgical history. History reviewed. No pertinent family history. History  Substance Use Topics  . Smoking status: Never Smoker   . Smokeless tobacco: Never Used  . Alcohol Use: Yes    Review of Systems  Constitutional: Negative for fever, diaphoresis, appetite change, fatigue and unexpected weight change.  HENT: Negative for mouth sores.   Eyes: Negative for visual disturbance.  Respiratory: Positive for cough and shortness of breath. Negative for chest tightness and wheezing.   Cardiovascular: Positive for chest pain.  Gastrointestinal: Negative for nausea, vomiting, abdominal pain, diarrhea and constipation.  Endocrine: Negative for polydipsia, polyphagia and polyuria.  Genitourinary: Negative for dysuria,  urgency, frequency and hematuria.  Musculoskeletal: Negative for back pain and neck stiffness.  Skin: Negative for rash.  Allergic/Immunologic: Negative for immunocompromised state.  Neurological: Negative for syncope, light-headedness and headaches.  Hematological: Does not bruise/bleed easily.  Psychiatric/Behavioral: Negative for sleep disturbance. The patient is not nervous/anxious.     Allergies  Review of patient's allergies indicates no known allergies.  Home Medications   Current Outpatient Rx  Name  Route  Sig  Dispense  Refill  . Multiple Vitamins-Minerals (CENTRUM PO)   Oral   Take 1 tablet by mouth daily.         . naproxen sodium (ANAPROX) 220 MG tablet   Oral   Take 440 mg by mouth daily as needed (headache).         . vitamin C (ASCORBIC ACID) 500 MG tablet   Oral   Take 1,000 mg by mouth daily.         Marland Kitchen ibuprofen (ADVIL,MOTRIN) 800 MG tablet   Oral   Take 1 tablet (800 mg total) by mouth 3 (three) times daily.   21 tablet   0    BP 129/88  Pulse 59  Temp(Src) 98.1 F (36.7 C) (Oral)  Resp 13  Ht 6' (1.829 m)  Wt 226 lb (102.513 kg)  BMI 30.64 kg/m2  SpO2 99% Physical Exam  Nursing note and vitals reviewed. Constitutional: He appears well-developed and well-nourished. No distress.  Awake, alert, nontoxic appearance  HENT:  Head: Normocephalic and atraumatic.  Mouth/Throat: Oropharynx is clear and moist. No oropharyngeal exudate.  Eyes: Conjunctivae are normal. No scleral icterus.  Neck: Normal range of motion. Neck supple.  Cardiovascular: Normal rate, regular rhythm and intact distal pulses.   Pulmonary/Chest: Effort normal and breath sounds normal. No respiratory distress. He has no wheezes. He exhibits tenderness (mild, anterior).  Abdominal: Soft. Bowel sounds are normal. He exhibits no mass. There is no tenderness. There is no rebound and no guarding.  Musculoskeletal: Normal range of motion. He exhibits no edema.  Mild tenderness to  palpation of the left half Negative Homans sign No palpable cord No edema or swelling of the left lower leg  Neurological: He is alert.  Speech is clear and goal oriented Moves extremities without ataxia  Skin: Skin is warm and dry. He is not diaphoretic. There is erythema.  Psychiatric: He has a normal mood and affect.    ED Course  Procedures (including critical care time) Labs Review Labs Reviewed  COMPREHENSIVE METABOLIC PANEL - Abnormal; Notable for the following:    GFR calc non Af Amer 83 (*)    All other components within normal limits  CBC  TROPONIN I  D-DIMER, QUANTITATIVE  TROPONIN I   Imaging Review Dg Chest 2 View  01/23/2013   CLINICAL DATA:  Chest pain.  EXAM: CHEST - 2 VIEW  COMPARISON:  02/04/2010  FINDINGS: The heart size and mediastinal contours are within normal limits. There is no evidence of pulmonary edema, consolidation, pneumothorax, nodule or pleural fluid. Mild atelectasis present at both lung bases. The visualized skeletal structures are unremarkable.  IMPRESSION: No active disease.   Electronically Signed   By: Irish Lack M.D.   On: 01/23/2013 20:22    EKG Interpretation    Date/Time:  Tuesday January 23 2013 19:32:22 EST Ventricular Rate:  54 PR Interval:  174 QRS Duration: 87 QT Interval:  426 QTC Calculation: 404 R Axis:   80 Text Interpretation:  Sinus rhythm Probable lateral infarct, age indeterminate Anteroseptal infarct, old Non-specific ST-t changes Confirmed by Manus Gunning  MD, STEPHEN ([id number]) on 01/23/2013 7:45:12 PM            MDM   1. Atypical chest pain      Kyle Dominguez presents with chest pain and 2 days of cough.  Pain is reproducible and I doubt ACS at this time.  Note from urgent care stay unstable angina but I disagree with this.  Will assess for ACS.  Chest pain is not likely of cardiac or pulmonary etiology d/t presentation, PERC negative and negative d-dimer, VSS, no tracheal deviation, no JVD or new murmur,  RRR, breath sounds equal bilaterally, EKG with only nonspecific ST changes, negative troponin x2, and negative CXR. Pt has been advised start ibuprofen to treat possible viral costochondritis and return to the ED if CP becomes exertional, associated with diaphoresis or nausea, radiates to left jaw/arm, worsens or becomes concerning in any way. Pt appears reliable for follow up and is agreeable to discharge.   Case has been discussed with and seen by Dr. Manus Gunning who agrees with the above plan to discharge.   It has been determined that no acute conditions requiring further emergency intervention are present at this time. The patient/guardian have been advised of the diagnosis and plan. We have discussed signs and symptoms that warrant return to the ED, such as changes or worsening in symptoms.   Vital signs are stable at discharge.   BP 129/88  Pulse 59  Temp(Src) 98.1 F (36.7 C) (Oral)  Resp 13  Ht 6' (1.829 m)  Wt 226 lb (102.513 kg)  BMI 30.64 kg/m2  SpO2 99%  Patient/guardian has voiced understanding and agreed to follow-up with the PCP or specialist.        Dierdre Forth, PA-C 01/24/13 0129

## 2013-01-24 NOTE — ED Provider Notes (Signed)
Medical screening examination/treatment/procedure(s) were conducted as a shared visit with non-physician practitioner(s) and myself.  I personally evaluated the patient during the encounter.  4 day history of constant L sided chest pain, worse with palpation and arm movement. Associated with cough. No exertional chest pain. EKG with nonspecific ST changes. No previous cardiac history. Pain reproducible and constant, atypical for ACS or PE. Troponin neg x2. Advised to schedule stress test with PCP.  EKG Interpretation    Date/Time:  Tuesday January 23 2013 19:32:22 EST Ventricular Rate:  54 PR Interval:  174 QRS Duration: 87 QT Interval:  426 QTC Calculation: 404 R Axis:   80 Text Interpretation:  Sinus rhythm Probable lateral infarct, age indeterminate Anteroseptal infarct, old Non-specific ST-t changes Confirmed by Manus Gunning  MD, Isidro Monks ([id number]) on 01/23/2013 7:45:12 PM             Glynn Octave, MD 01/24/13 [phone]

## 2013-01-27 ENCOUNTER — Encounter (HOSPITAL_COMMUNITY): Payer: Self-pay | Admitting: Emergency Medicine

## 2013-01-27 ENCOUNTER — Emergency Department (HOSPITAL_COMMUNITY)
Admission: EM | Admit: 2013-01-27 | Discharge: 2013-01-27 | Disposition: A | Discharge status: Home / Self Care | Payer: Managed Care, Other (non HMO) | Attending: Emergency Medicine | Admitting: Emergency Medicine

## 2013-01-27 DIAGNOSIS — R079 Chest pain, unspecified: Secondary | ICD-10-CM | POA: Insufficient documentation

## 2013-01-27 DIAGNOSIS — Z791 Long term (current) use of non-steroidal anti-inflammatories (NSAID): Secondary | ICD-10-CM | POA: Insufficient documentation

## 2013-01-27 DIAGNOSIS — I10 Essential (primary) hypertension: Secondary | ICD-10-CM | POA: Insufficient documentation

## 2013-01-27 DIAGNOSIS — Z79899 Other long term (current) drug therapy: Secondary | ICD-10-CM | POA: Insufficient documentation

## 2013-01-27 LAB — CBC
HCT: 44.7 % (ref 39.0–52.0)
Hemoglobin: 15.1 g/dL (ref 13.0–17.0)
MCH: 28 pg (ref 26.0–34.0)
MCHC: 33.8 g/dL (ref 30.0–36.0)
RBC: 5.39 MIL/uL (ref 4.22–5.81)
RDW: 13.3 % (ref 11.5–15.5)
WBC: 5.5 10*3/uL (ref 4.0–10.5)

## 2013-01-27 LAB — BASIC METABOLIC PANEL
BUN: 14 mg/dL (ref 6–23)
CO2: 27 mEq/L (ref 19–32)
Calcium: 9.5 mg/dL (ref 8.4–10.5)
GFR calc Af Amer: 80 mL/min — ABNORMAL LOW (ref >90)
GFR calc non Af Amer: 69 mL/min — ABNORMAL LOW (ref >90)
Glucose, Bld: 92 mg/dL (ref 70–99)
Potassium: 3.8 mEq/L (ref 3.5–5.1)

## 2013-01-27 MED ORDER — HYDROCODONE-ACETAMINOPHEN 5-325 MG PO TABS: 1.0000 | ORAL_TABLET | Freq: Four times a day (QID) | ORAL | Status: DC | PRN

## 2013-01-27 MED ORDER — SODIUM CHLORIDE 0.9 % IV SOLN
INTRAVENOUS | Status: DC
  Administered 2013-01-27: 19:00:00 via INTRAVENOUS

## 2013-01-27 MED ORDER — HYDROMORPHONE HCL PF 1 MG/ML IJ SOLN
0.5000 mg | Freq: Once | INTRAMUSCULAR | Status: AC
  Administered 2013-01-27: 0.5 mg via INTRAVENOUS
  Filled 2013-01-27: qty 1

## 2013-01-27 MED ORDER — SODIUM CHLORIDE 0.9 % IV BOLUS (SEPSIS)
250.0000 mL | Freq: Once | INTRAVENOUS | Status: AC
  Administered 2013-01-27: 250 mL via INTRAVENOUS

## 2013-01-27 MED ORDER — ONDANSETRON HCL 4 MG/2ML IJ SOLN
4.0000 mg | Freq: Once | INTRAMUSCULAR | Status: AC
  Administered 2013-01-27: 4 mg via INTRAVENOUS
  Filled 2013-01-27: qty 2

## 2013-01-27 MED ORDER — FAMOTIDINE 20 MG PO TABS: 20.0000 mg | ORAL_TABLET | Freq: Two times a day (BID) | ORAL | Status: DC

## 2013-01-27 MED ORDER — PANTOPRAZOLE SODIUM 40 MG IV SOLR
40.0000 mg | Freq: Once | INTRAVENOUS | Status: AC
  Administered 2013-01-27: 40 mg via INTRAVENOUS
  Filled 2013-01-27: qty 40

## 2013-01-27 NOTE — ED Provider Notes (Signed)
CSN: [id number]     Arrival date & time 01/27/13  1754 History   First MD Initiated Contact with Patient 01/27/13 1819     Chief Complaint  Patient presents with  . Chest Pain   (Consider location/radiation/quality/duration/timing/severity/associated sxs/prior Treatment) Patient is a 42 y.o. male presenting with chest pain. The history is provided by the patient.  Chest Pain Associated symptoms: no abdominal pain, no back pain, no fever, no headache, no nausea, no palpitations, no shortness of breath and not vomiting    patient seen the emergency department on Tuesday with negative workup for same symptoms. Patient had troponin negative x2 along with negative chest x-ray and d-dimer. Patient with onset of lower chest pain approximately 10 days ago. On off during the daytime almost constant during the night. The pain now radiates from the lower anterior chest over to the left chest and into the left shoulder and down the left arm. Patient has tried antacids at home has tried anti-inflammatory meds without much help. Patient states that the pain is getting more intense. Pain is worse is 10 out of 10 currently it's not present. Not made worse or better by anything.  Past Medical History  Diagnosis Date  . Hx of migraines   . Hypertension    History reviewed. No pertinent past surgical history. History reviewed. No pertinent family history. History  Substance Use Topics  . Smoking status: Never Smoker   . Smokeless tobacco: Never Used  . Alcohol Use: Yes    Review of Systems  Constitutional: Negative for fever.  HENT: Negative for congestion.   Eyes: Negative for redness.  Respiratory: Positive for chest tightness. Negative for shortness of breath.   Cardiovascular: Positive for chest pain. Negative for palpitations.  Gastrointestinal: Negative for nausea, vomiting and abdominal pain.  Genitourinary: Negative for dysuria.  Musculoskeletal: Negative for back pain.  Skin: Negative for  rash.  Neurological: Negative for headaches.  Hematological: Does not bruise/bleed easily.  Psychiatric/Behavioral: Negative for confusion.    Allergies  Review of patient's allergies indicates no known allergies.  Home Medications   Current Outpatient Rx  Name  Route  Sig  Dispense  Refill  . ibuprofen (ADVIL,MOTRIN) 800 MG tablet   Oral   Take 1 tablet (800 mg total) by mouth 3 (three) times daily.   21 tablet   0   . Multiple Vitamins-Minerals (CENTRUM PO)   Oral   Take 1 tablet by mouth daily.         . naproxen sodium (ANAPROX) 220 MG tablet   Oral   Take 440 mg by mouth daily as needed (headache).         . vitamin C (ASCORBIC ACID) 500 MG tablet   Oral   Take 1,000 mg by mouth daily.          BP 139/86  Pulse 70  Temp(Src) 98.7 F (37.1 C) (Oral)  Resp 16  SpO2 99% Physical Exam  Nursing note and vitals reviewed. Constitutional: He is oriented to person, place, and time. He appears well-developed and well-nourished. No distress.  HENT:  Head: Normocephalic and atraumatic.  Mouth/Throat: Oropharynx is clear and moist.  Eyes: Conjunctivae and EOM are normal. Pupils are equal, round, and reactive to light.  Neck: Normal range of motion.  Cardiovascular: Normal rate, regular rhythm and normal heart sounds.   No murmur heard. Pulmonary/Chest: Effort normal and breath sounds normal. No respiratory distress.  Abdominal: Soft. Bowel sounds are normal. There is no tenderness.  Neurological: He is alert and oriented to person, place, and time. No cranial nerve deficit. He exhibits normal muscle tone. Coordination normal.  Skin: Skin is warm. No rash noted.    ED Course  Procedures (including critical care time) Labs Review Labs Reviewed  CBC  BASIC METABOLIC PANEL  POCT I-STAT TROPONIN I   Results for orders placed during the hospital encounter of 01/27/13  CBC      Result Value Range   WBC 5.5  4.0 - 10.5 K/uL   RBC 5.39  4.22 - 5.81 MIL/uL    Hemoglobin 15.1  13.0 - 17.0 g/dL   HCT 16.1  09.6 - 04.5 %   MCV 82.9  78.0 - 100.0 fL   MCH 28.0  26.0 - 34.0 pg   MCHC 33.8  30.0 - 36.0 g/dL   RDW 40.9  81.1 - 91.4 %   Platelets 218  150 - 400 K/uL  BASIC METABOLIC PANEL      Result Value Range   Sodium 138  135 - 145 mEq/L   Potassium 3.8  3.5 - 5.1 mEq/L   Chloride 101  96 - 112 mEq/L   CO2 27  19 - 32 mEq/L   Glucose, Bld 92  70 - 99 mg/dL   BUN 14  6 - 23 mg/dL   Creatinine, Ser 7.82  0.50 - 1.35 mg/dL   Calcium 9.5  8.4 - 95.6 mg/dL   GFR calc non Af Amer 69 (*) >90 mL/min   GFR calc Af Amer 80 (*) >90 mL/min  POCT I-STAT TROPONIN I      Result Value Range   Troponin i, poc 0.00  0.00 - 0.08 ng/mL   Comment 3             Imaging Review No results found.  EKG Interpretation   None      Date: 01/27/2013  Rate: 71  Rhythm: normal sinus rhythm  QRS Axis: normal  Intervals: normal  ST/T Wave abnormalities: normal  Conduction Disutrbances:none  Narrative Interpretation:   Old EKG Reviewed: unchanged Nonspecific T wave abnormality no significant change in EKG compared to December 2.   MDM   1. Chest pain      Patient with very frequent chest pain for over a week now much worse at night clinically very suggestive of acid reflux GERD with esophageal spasm. Patient seen in the emergency department on Tuesday with negative troponin x2 chest x-ray without abnormalities d-dimer that was negative. Patient does have followup with family practice. We'll repeat basic labs here today and treated with protonic. Plan will probably be to put him on a two-week course of Pepcid if labs are negative here today.    Shelda Jakes, MD 01/27/13 [phone]

## 2013-01-27 NOTE — ED Notes (Signed)
Pt reports that he was seen here on Tuesday for the same. States that he was told to follow up with PCP but did not make it. Reports that he is now having pain up into the left shoulder. Denies any nausea or vomiting, but does have light-headedness

## 2013-01-30 ENCOUNTER — Ambulatory Visit: Payer: Managed Care, Other (non HMO) | Admitting: Family Medicine

## 2013-01-31 ENCOUNTER — Encounter: Payer: Self-pay | Admitting: Family Medicine

## 2013-01-31 ENCOUNTER — Ambulatory Visit (INDEPENDENT_AMBULATORY_CARE_PROVIDER_SITE_OTHER): Payer: Managed Care, Other (non HMO) | Admitting: Family Medicine

## 2013-01-31 VITALS — BP 125/85 | HR 60 | Ht 72.0 in | Wt 226.0 lb

## 2013-01-31 DIAGNOSIS — G43909 Migraine, unspecified, not intractable, without status migrainosus: Secondary | ICD-10-CM

## 2013-01-31 DIAGNOSIS — R079 Chest pain, unspecified: Secondary | ICD-10-CM

## 2013-01-31 NOTE — Patient Instructions (Signed)
Likely talked about, I think that the pain is from your chest wall. I would recommend that you take the ibuprofen 800 mg 3 times a day with food for the next 2 days. After that he can take it as needed. It is important to continue taking the Prilosec to help with protection of your stomach while you're taking the ibuprofen. Followup with Dr. Clinton Sawyer in 2 weeks for both the chest pain and migraine headaches. We will contact you with the specifics of the stress test.

## 2013-02-01 DIAGNOSIS — R079 Chest pain, unspecified: Secondary | ICD-10-CM | POA: Insufficient documentation

## 2013-02-01 NOTE — Assessment & Plan Note (Addendum)
Atypical chest pain. Troponin and EKGs were normal in the emergency room which is reassuring. He has low risk factors: Other than hypertension, he has No family history of heart disease, no smoking, no previous history of heart disease or hyperlipidemia. It also appears to be very musculoskeletal in nature, given pain with movement of his arm as well as reproducible tenderness on exam. Nonetheless given his age and recurrence of symptoms, would like to completely exclude cardiac cause and obtain exercise stress test.  - For now treat with ibuprofen 800mg  3 times a day for 2 days. Then take on an as-needed basis. Since patient does have a history of heartburn, recommended that he continue taking the Prilosec and take ibuprofen with food. His chest pain does not appear to be from GERD or peptic ulcer at this time.

## 2013-02-01 NOTE — Progress Notes (Signed)
Patient ID: Kyle Dominguez    DOB: Mar 28, 1970, 42 y.o.   MRN: [id number] --- Subjective:  Kyle Dominguez is a 42 y.o.male who presents for followup on ED visit from 01/27/2013. He was seen in the ED for evaluation of chest pain. He he was evaluated with a chest x-ray which was normal. He had 2 sets of troponins which were normal. His EKG was unremarkable. D-dimer was negative as well. He was discharged with Prilosec to treat GERD which was thought to be the source of his pain. He tells me that he has had chest pain in the left side of his chest for the last 2 weeks. It has been occurring every day. Last time he had pain was yesterday in the evening. He describes the pain as a sharp shooting pain that starts from the front of the left chest and radiates to the back on the left side. Pain occurs when he moves his arm around or when he twists his chest. Pain occurs both at rest and with exertion. He denies any trauma to the chest, or any heavy lifting or recent change in activity. He denies any burning sensation or pain after eating. He denies any palpitations, any shortness of breath.  Additionally, he has had an occipital headache, similar to previous migraine headaches. He reports getting 3 migraines a week the last few weeks. The frequency of migraines has increased ever since his chest pain started. He describes the pain as throbbing and constant. He has had 4 episodes of emesis this morning which is not unusual for his migraine. He denies any change in vision. He reports photophobia and phonophobia.  ROS: see HPI Past Medical History: reviewed and updated medications and allergies. Social History: Tobacco: None  Objective: Filed Vitals:   01/31/13 0952  BP: 125/85  Pulse: 60    Physical Examination:   General appearance - alert,  fatigued-appearing, in no acute distress Neuro-cranial nerves II through XII grossly intact, upper extremity strength normal bilaterally. Chest - clear to auscultation,  no wheezes, rales or rhonchi, symmetric air entry Reproducible Tenderness to palpation along chest wall on left side  Heart - normal rate, regular rhythm, normal S1, S2, no murmurs Extremities - no edema bilaterally

## 2013-02-01 NOTE — Assessment & Plan Note (Signed)
Migraine headache resolving on the day of visit. No focal neuro deficit. Patient did not want any abortive therapy in the clinic. Recommended that he followup with his PCP to talk about treatment and possible prophylaxis.

## 2013-02-20 ENCOUNTER — Ambulatory Visit: Payer: Managed Care, Other (non HMO) | Admitting: Sports Medicine

## 2013-03-05 ENCOUNTER — Ambulatory Visit: Payer: Managed Care, Other (non HMO) | Admitting: Sports Medicine

## 2013-03-06 ENCOUNTER — Encounter: Payer: Self-pay | Admitting: Family Medicine

## 2013-03-14 ENCOUNTER — Emergency Department (INDEPENDENT_AMBULATORY_CARE_PROVIDER_SITE_OTHER): Payer: Managed Care, Other (non HMO)

## 2013-03-14 ENCOUNTER — Encounter (HOSPITAL_COMMUNITY): Payer: Self-pay | Admitting: Emergency Medicine

## 2013-03-14 ENCOUNTER — Emergency Department (INDEPENDENT_AMBULATORY_CARE_PROVIDER_SITE_OTHER)
Admission: EM | Admit: 2013-03-14 | Discharge: 2013-03-14 | Disposition: A | Discharge status: Home / Self Care | Payer: Managed Care, Other (non HMO) | Source: Home / Self Care | Attending: Family Medicine | Admitting: Family Medicine

## 2013-03-14 DIAGNOSIS — B9789 Other viral agents as the cause of diseases classified elsewhere: Principal | ICD-10-CM

## 2013-03-14 DIAGNOSIS — J069 Acute upper respiratory infection, unspecified: Secondary | ICD-10-CM

## 2013-03-14 DIAGNOSIS — R079 Chest pain, unspecified: Secondary | ICD-10-CM

## 2013-03-14 MED ORDER — HYDROCODONE-ACETAMINOPHEN 5-325 MG PO TABS: 0.5000 | ORAL_TABLET | Freq: Every evening | ORAL | Status: DC | PRN

## 2013-03-14 NOTE — ED Provider Notes (Signed)
Kyle Dominguez is a 43 y.o. male who presents to Urgent Care today for one day of cough and chest pain. Patient has right-sided sharp moderate chest pain worse with deep inspiration and cough. He denies any exertional or radiating pain. He notes some chills but denies any fevers nausea vomiting diarrhea or shortness of breath. He denies any leg swelling or recent travel. He's tried some over-the-counter medications which have helped a little. He feels well otherwise.  He was evaluated in the emergency room in early December for chest pain with normal troponins. He has never had a stress exam since.   Past Medical History  Diagnosis Date  . Hx of migraines   . Hypertension    History  Substance Use Topics  . Smoking status: Never Smoker   . Smokeless tobacco: Never Used  . Alcohol Use: Yes   ROS as above Medications: No current facility-administered medications for this encounter.   Current Outpatient Prescriptions  Medication Sig Dispense Refill  . famotidine (PEPCID) 20 MG tablet Take 1 tablet (20 mg total) by mouth 2 (two) times daily.  30 tablet  0  . HYDROcodone-acetaminophen (NORCO/VICODIN) 5-325 MG per tablet Take 0.5 tablets by mouth at bedtime as needed (cough).  10 tablet  0  . ibuprofen (ADVIL,MOTRIN) 800 MG tablet Take 1 tablet (800 mg total) by mouth 3 (three) times daily.  21 tablet  0  . Multiple Vitamins-Minerals (CENTRUM PO) Take 1 tablet by mouth daily.      . vitamin C (ASCORBIC ACID) 500 MG tablet Take 1,000 mg by mouth daily.        Exam:  BP 152/96  Pulse 57  Temp(Src) 97.9 F (36.6 C) (Oral)  Resp 18  SpO2 99% Gen: Well NAD HEENT: EOMI,  MMM posterior pharynx with cobblestoning. Tympanic membranes are normal appearing bilaterally. Lungs: Normal work of breathing. CTABL  Heart: Slightly bradycardia but regular no MRG Abd: NABS, Soft. NT, ND Exts: Brisk capillary refill, warm and well perfused.    Twelve-lead EKG shows sinus bradycardia at 49 beats  per minute. ST segment depression in the lateral precordial leads otherwise no significant change from prior EKGs.  No results found for this or any previous visit (from the past 24 hour(s)). Dg Chest 2 View  03/14/2013   CLINICAL DATA:  Chest pain, cold and bronchitis  EXAM: CHEST  2 VIEW  COMPARISON:  DG CHEST 2 VIEW dated 01/23/2013  FINDINGS: Normal mediastinum and cardiac silhouette. Normal pulmonary vasculature. No evidence of effusion, infiltrate, or pneumothorax. No acute bony abnormality.  IMPRESSION: No acute cardiopulmonary process.   Electronically Signed   By: Genevive BiStewart  Edmunds M.D.   On: 03/14/2013 12:36    Assessment and Plan: 43 y.o. male with cough congestion and pleuritic type chest pain. Patient does have a slightly abnormal EKG compared to prior studies. He recently was evaluated chest pain exacerbation.  I discussed the case with on-call cardiology. We will followup in the office for a stress exam in the near future. In the meantime if he develops continuous or central or exertional crushing or anginal type chest pain he presented directly to the emergency room. We'll treat current symptoms with Norco for cough and NSAIDs for pain control.   Discussed warning signs or symptoms. Please see discharge instructions. Patient expresses understanding.    Rodolph BongEvan S Corey, MD 03/14/13 1246

## 2013-03-14 NOTE — ED Notes (Signed)
Dr. Denyse Amassorey is in room already Pt c/o cough, HA and CP due to cough Denies: f/v/n/d, SOB Has been taking dayquil and tyle w/no relief He is alert and talking in complete sentences w/no signs of acute distress

## 2013-03-14 NOTE — Discharge Instructions (Signed)
Thank you for coming in today. Use Norco for cough as needed. Do not drive while taking this medication Take up to 2 Aleve twice daily as needed for pain.  If you or chest pain gets worse or becomes constant or worse when you exert your self or go directly to the emergency room. The cardiologist's office should call you in a day or 2 to set up a stress test. If they do not call back call the office and make an appointment yourself.  Call or go to the emergency room if you get worse, have trouble breathing, have chest pains, or palpitations.   Chest Pain (Nonspecific) It is often hard to give a specific diagnosis for the cause of chest pain. There is always a chance that your pain could be related to something serious, such as a heart attack or a blood clot in the lungs. You need to follow up with your caregiver for further evaluation. CAUSES   Heartburn.  Pneumonia or bronchitis.  Anxiety or stress.  Inflammation around your heart (pericarditis) or lung (pleuritis or pleurisy).  A blood clot in the lung.  A collapsed lung (pneumothorax). It can develop suddenly on its own (spontaneous pneumothorax) or from injury (trauma) to the chest.  Shingles infection (herpes zoster virus). The chest wall is composed of bones, muscles, and cartilage. Any of these can be the source of the pain.  The bones can be bruised by injury.  The muscles or cartilage can be strained by coughing or overwork.  The cartilage can be affected by inflammation and become sore (costochondritis). DIAGNOSIS  Lab tests or other studies, such as X-rays, electrocardiography, stress testing, or cardiac imaging, may be needed to find the cause of your pain.  TREATMENT   Treatment depends on what may be causing your chest pain. Treatment may include:  Acid blockers for heartburn.  Anti-inflammatory medicine.  Pain medicine for inflammatory conditions.  Antibiotics if an infection is present.  You may be advised  to change lifestyle habits. This includes stopping smoking and avoiding alcohol, caffeine, and chocolate.  You may be advised to keep your head raised (elevated) when sleeping. This reduces the chance of acid going backward from your stomach into your esophagus.  Most of the time, nonspecific chest pain will improve within 2 to 3 days with rest and mild pain medicine. HOME CARE INSTRUCTIONS   If antibiotics were prescribed, take your antibiotics as directed. Finish them even if you start to feel better.  For the next few days, avoid physical activities that bring on chest pain. Continue physical activities as directed.  Do not smoke.  Avoid drinking alcohol.  Only take over-the-counter or prescription medicine for pain, discomfort, or fever as directed by your caregiver.  Follow your caregiver's suggestions for further testing if your chest pain does not go away.  Keep any follow-up appointments you made. If you do not go to an appointment, you could develop lasting (chronic) problems with pain. If there is any problem keeping an appointment, you must call to reschedule. SEEK MEDICAL CARE IF:   You think you are having problems from the medicine you are taking. Read your medicine instructions carefully.  Your chest pain does not go away, even after treatment.  You develop a rash with blisters on your chest. SEEK IMMEDIATE MEDICAL CARE IF:   You have increased chest pain or pain that spreads to your arm, neck, jaw, back, or abdomen.  You develop shortness of breath, an increasing cough,  or you are coughing up blood.  You have severe back or abdominal pain, feel nauseous, or vomit.  You develop severe weakness, fainting, or chills.  You have a fever. THIS IS AN EMERGENCY. Do not wait to see if the pain will go away. Get medical help at once. Call your local emergency services (911 in [place] Do not drive yourself to the hospital. MAKE SURE YOU:   Understand these  instructions.  Will watch your condition.  Will get help right away if you are not doing well or get worse. Document Released: 11/18/2004 Document Revised: 05/03/2011 Document Reviewed: 09/14/2007 [facility] Patient Information 2014 [place], [place].

## 2013-05-18 ENCOUNTER — Emergency Department (INDEPENDENT_AMBULATORY_CARE_PROVIDER_SITE_OTHER)
Admission: EM | Admit: 2013-05-18 | Discharge: 2013-05-18 | Disposition: A | Discharge status: Home / Self Care | Payer: Managed Care, Other (non HMO) | Source: Home / Self Care

## 2013-05-18 ENCOUNTER — Encounter (HOSPITAL_COMMUNITY): Payer: Self-pay | Admitting: Emergency Medicine

## 2013-05-18 DIAGNOSIS — J309 Allergic rhinitis, unspecified: Secondary | ICD-10-CM

## 2013-05-18 NOTE — ED Provider Notes (Signed)
CSN: [id number]     Arrival date & time 05/18/13  [id number] History   First MD Initiated Contact with Patient 05/18/13 1026     Chief Complaint  Patient presents with  . URI   (Consider location/radiation/quality/duration/timing/severity/associated sxs/prior Treatment) HPI Comments: 43 year old male complaining of a 24 hour history of sore throat, PND, cough, runny nose and headache. Denies fever, shortness of breath or chest congestion. He has taken in a TC cough medicine without relief.   Past Medical History  Diagnosis Date  . Hx of migraines   . Hypertension    History reviewed. No pertinent past surgical history. History reviewed. No pertinent family history. History  Substance Use Topics  . Smoking status: Never Smoker   . Smokeless tobacco: Never Used  . Alcohol Use: Yes    Review of Systems  Constitutional: Negative for fever, diaphoresis, activity change and fatigue.  HENT: Positive for congestion, postnasal drip, rhinorrhea, sinus pressure and sore throat. Negative for ear pain, facial swelling and trouble swallowing.   Eyes: Negative for pain, discharge and redness.  Respiratory: Positive for cough. Negative for chest tightness, shortness of breath, wheezing and stridor.   Cardiovascular: Negative.   Gastrointestinal: Negative.   Musculoskeletal: Negative.  Negative for neck pain and neck stiffness.  Skin: Negative for pallor and rash.  Neurological: Negative.     Allergies  Review of patient's allergies indicates no known allergies.  Home Medications   Current Outpatient Rx  Name  Route  Sig  Dispense  Refill  . famotidine (PEPCID) 20 MG tablet   Oral   Take 1 tablet (20 mg total) by mouth 2 (two) times daily.   30 tablet   0   . HYDROcodone-acetaminophen (NORCO/VICODIN) 5-325 MG per tablet   Oral   Take 0.5 tablets by mouth at bedtime as needed (cough).   10 tablet   0   . ibuprofen (ADVIL,MOTRIN) 800 MG tablet   Oral   Take 1 tablet (800 mg total) by  mouth 3 (three) times daily.   21 tablet   0   . Multiple Vitamins-Minerals (CENTRUM PO)   Oral   Take 1 tablet by mouth daily.         . vitamin C (ASCORBIC ACID) 500 MG tablet   Oral   Take 1,000 mg by mouth daily.          BP 140/100  Pulse 72  Temp(Src) 98.6 F (37 C) (Oral)  Resp 16  SpO2 98% Physical Exam  Nursing note and vitals reviewed. Constitutional: He is oriented to person, place, and time. He appears well-developed and well-nourished. No distress.  HENT:  Mouth/Throat: No oropharyngeal exudate.  Bilateral TMs are normal Oropharynx with minor erythema, clear PND and cobblestoning  Eyes: Conjunctivae and EOM are normal.  Neck: Normal range of motion. Neck supple.  Cardiovascular: Normal rate, regular rhythm and normal heart sounds.   Pulmonary/Chest: Effort normal and breath sounds normal. No respiratory distress. He has no wheezes. He has no rales.  Musculoskeletal: Normal range of motion. He exhibits no edema.  Lymphadenopathy:    He has no cervical adenopathy.  Neurological: He is alert and oriented to person, place, and time.  Skin: Skin is warm and dry. No rash noted.  Psychiatric: He has a normal mood and affect.    ED Course  Procedures (including critical care time) Labs Review Labs Reviewed - No data to display Imaging Review No results found.   MDM   1. Allergic rhinosinusitis  Nasal saline flonase Allegra Robitussin Sudafed PE    Hayden Rasmussen, NP 05/18/13 1040

## 2013-05-18 NOTE — ED Notes (Signed)
Pt  Reports  Symptoms  Of  Nasal  Congestion  Stuffy  Nose        /  Cough    With  Onset  Of  Symptoms   yest    Wife  Had  Similar  Symptoms  Recently      Pt  Reports  Symptoms  Not  releived  By OTC   meds           Pt  Masked  And  Is  In a  pvt  Room

## 2013-05-18 NOTE — Discharge Instructions (Signed)
Allergic Rhinitis Flonase nasal spray as directed Allegra 180 mg a day Sudafed PE 10 mg for congestion Saline nasal spray use a lot and often to clean out nose Plain Robitussin, get generic tabs or liquid Allergic rhinitis is when the mucous membranes in the nose respond to allergens. Allergens are particles in the air that cause your body to have an allergic reaction. This causes you to release allergic antibodies. Through a chain of events, these eventually cause you to release histamine into the blood stream. Although meant to protect the body, it is this release of histamine that causes your discomfort, such as frequent sneezing, congestion, and an itchy, runny nose.  CAUSES  Seasonal allergic rhinitis (hay fever) is caused by pollen allergens that may come from grasses, trees, and weeds. Year-round allergic rhinitis (perennial allergic rhinitis) is caused by allergens such as house dust mites, pet dander, and mold spores.  SYMPTOMS   Nasal stuffiness (congestion).  Itchy, runny nose with sneezing and tearing of the eyes. DIAGNOSIS  Your health care provider can help you determine the allergen or allergens that trigger your symptoms. If you and your health care provider are unable to determine the allergen, skin or blood testing may be used. TREATMENT  Allergic Rhinitis does not have a cure, but it can be controlled by:  Medicines and allergy shots (immunotherapy).  Avoiding the allergen. Hay fever may often be treated with antihistamines in pill or nasal spray forms. Antihistamines block the effects of histamine. There are over-the-counter medicines that may help with nasal congestion and swelling around the eyes. Check with your health care provider before taking or giving this medicine.  If avoiding the allergen or the medicine prescribed do not work, there are many new medicines your health care provider can prescribe. Stronger medicine may be used if initial measures are ineffective.  Desensitizing injections can be used if medicine and avoidance does not work. Desensitization is when a patient is given ongoing shots until the body becomes less sensitive to the allergen. Make sure you follow up with your health care provider if problems continue. HOME CARE INSTRUCTIONS It is not possible to completely avoid allergens, but you can reduce your symptoms by taking steps to limit your exposure to them. It helps to know exactly what you are allergic to so that you can avoid your specific triggers. SEEK MEDICAL CARE IF:   You have a fever.  You develop a cough that does not stop easily (persistent).  You have shortness of breath.  You start wheezing.  Symptoms interfere with normal daily activities. Document Released: 11/03/2000 Document Revised: 11/29/2012 Document Reviewed: 10/16/2012 [facility] Patient Information 2014 [place].  Allergies Allergies may happen from anything your body is sensitive to. This may be food, medicines, pollens, chemicals, and nearly anything around you in everyday life that produces allergens. An allergen is anything that causes an allergy producing substance. Heredity is often a factor in causing these problems. This means you may have some of the same allergies as your parents. Food allergies happen in all age groups. Food allergies are some of the most severe and life threatening. Some common food allergies are cow's milk, seafood, eggs, nuts, wheat, and soybeans. SYMPTOMS   Swelling around the mouth.  An itchy red rash or hives.  Vomiting or diarrhea.  Difficulty breathing. SEVERE ALLERGIC REACTIONS ARE LIFE-THREATENING. This reaction is called anaphylaxis. It can cause the mouth and throat to swell and cause difficulty with breathing and swallowing. In severe reactions  only a trace amount of food (for example, peanut oil in a salad) may cause death within seconds. Seasonal allergies occur in all age groups. These are seasonal because  they usually occur during the same season every year. They may be a reaction to molds, grass pollens, or tree pollens. Other causes of problems are house dust mite allergens, pet dander, and mold spores. The symptoms often consist of nasal congestion, a runny itchy nose associated with sneezing, and tearing itchy eyes. There is often an associated itching of the mouth and ears. The problems happen when you come in contact with pollens and other allergens. Allergens are the particles in the air that the body reacts to with an allergic reaction. This causes you to release allergic antibodies. Through a chain of events, these eventually cause you to release histamine into the blood stream. Although it is meant to be protective to the body, it is this release that causes your discomfort. This is why you were given anti-histamines to feel better. If you are unable to pinpoint the offending allergen, it may be determined by skin or blood testing. Allergies cannot be cured but can be controlled with medicine. Hay fever is a collection of all or some of the seasonal allergy problems. It may often be treated with simple over-the-counter medicine such as diphenhydramine. Take medicine as directed. Do not drink alcohol or drive while taking this medicine. Check with your caregiver or package insert for child dosages. If these medicines are not effective, there are many new medicines your caregiver can prescribe. Stronger medicine such as nasal spray, eye drops, and corticosteroids may be used if the first things you try do not work well. Other treatments such as immunotherapy or desensitizing injections can be used if all else fails. Follow up with your caregiver if problems continue. These seasonal allergies are usually not life threatening. They are generally more of a nuisance that can often be handled using medicine. HOME CARE INSTRUCTIONS   If unsure what causes a reaction, keep a diary of foods eaten and symptoms  that follow. Avoid foods that cause reactions.  If hives or rash are present:  Take medicine as directed.  You may use an over-the-counter antihistamine (diphenhydramine) for hives and itching as needed.  Apply cold compresses (cloths) to the skin or take baths in cool water. Avoid hot baths or showers. Heat will make a rash and itching worse.  If you are severely allergic:  Following a treatment for a severe reaction, hospitalization is often required for closer follow-up.  Wear a medic-alert bracelet or necklace stating the allergy.  You and your family must learn how to give adrenaline or use an anaphylaxis kit.  If you have had a severe reaction, always carry your anaphylaxis kit or EpiPen with you. Use this medicine as directed by your caregiver if a severe reaction is occurring. Failure to do so could have a fatal outcome. SEEK MEDICAL CARE IF:  You suspect a food allergy. Symptoms generally happen within 30 minutes of eating a food.  Your symptoms have not gone away within 2 days or are getting worse.  You develop new symptoms.  You want to retest yourself or your child with a food or drink you think causes an allergic reaction. Never do this if an anaphylactic reaction to that food or drink has happened before. Only do this under the care of a caregiver. SEEK IMMEDIATE MEDICAL CARE IF:   You have difficulty breathing, are wheezing, or have  a tight feeling in your chest or throat.  You have a swollen mouth, or you have hives, swelling, or itching all over your body.  You have had a severe reaction that has responded to your anaphylaxis kit or an EpiPen. These reactions may return when the medicine has worn off. These reactions should be considered life threatening. MAKE SURE YOU:   Understand these instructions.  Will watch your condition.  Will get help right away if you are not doing well or get worse. Document Released: 05/04/2002 Document Revised: 06/05/2012  Document Reviewed: 10/09/2007 [facility] Patient Information 2014 [place].

## 2013-05-20 NOTE — ED Provider Notes (Signed)
Medical screening examination/treatment/procedure(s) were performed by a resident physician or non-physician practitioner and as the supervising physician I was immediately available for consultation/collaboration.  Neko Mcgeehan, MD    Sundeep Cary S Charvis Lightner, MD 05/20/13 0848 

## 2013-05-21 ENCOUNTER — Ambulatory Visit (INDEPENDENT_AMBULATORY_CARE_PROVIDER_SITE_OTHER): Payer: Managed Care, Other (non HMO) | Admitting: Family Medicine

## 2013-05-21 ENCOUNTER — Encounter: Payer: Self-pay | Admitting: Family Medicine

## 2013-05-21 VITALS — BP 162/93 | HR 66 | Temp 98.5°F | Ht 72.0 in | Wt 226.0 lb

## 2013-05-21 DIAGNOSIS — I10 Essential (primary) hypertension: Secondary | ICD-10-CM

## 2013-05-21 DIAGNOSIS — R590 Localized enlarged lymph nodes: Secondary | ICD-10-CM

## 2013-05-21 DIAGNOSIS — J019 Acute sinusitis, unspecified: Secondary | ICD-10-CM

## 2013-05-21 DIAGNOSIS — J309 Allergic rhinitis, unspecified: Secondary | ICD-10-CM

## 2013-05-21 DIAGNOSIS — R599 Enlarged lymph nodes, unspecified: Secondary | ICD-10-CM

## 2013-05-21 MED ORDER — AMOXICILLIN-POT CLAVULANATE 875-125 MG PO TABS: 1.0000 | ORAL_TABLET | Freq: Two times a day (BID) | ORAL | Status: DC

## 2013-05-21 NOTE — Progress Notes (Signed)
   Subjective:    Patient ID: Kyle Dominguez, male    DOB: [birth year], 43 y.o.   MRN: [id number]  HPI  43 year old M presenting for followup of sinus pain and pressure. Also, he is concerned about a "bump" on his neck.   Sinusitis - The patient was seen in the urgent care clinic 4 days ago and diagnosed with allergic rhinosinusitis. He is prescribed nasal saline, Flonase, Allegra, Robitussin and Sudafed. This is not helping. He is having difficulty breathing out of his nose and persistent drainage. He denies nose bleeding, fever, ear pain, or sinus headache.   Bump on neck - Pt has not notice a small firm nodule on the right side of his neck near his jaw line for several months. He first noted it in December 2014. It is not pain and not changing in size. He has not other swollen areas like it. No hx of cancer. Denies night sweats, fatigue, and weight loss.   Current Outpatient Prescriptions on File Prior to Visit  Medication Sig Dispense Refill  . famotidine (PEPCID) 20 MG tablet Take 1 tablet (20 mg total) by mouth 2 (two) times daily.  30 tablet  0  . HYDROcodone-acetaminophen (NORCO/VICODIN) 5-325 MG per tablet Take 0.5 tablets by mouth at bedtime as needed (cough).  10 tablet  0  . ibuprofen (ADVIL,MOTRIN) 800 MG tablet Take 1 tablet (800 mg total) by mouth 3 (three) times daily.  21 tablet  0  . Multiple Vitamins-Minerals (CENTRUM PO) Take 1 tablet by mouth daily.      . vitamin C (ASCORBIC ACID) 500 MG tablet Take 1,000 mg by mouth daily.       No current facility-administered medications on file prior to visit.     Review of Systems See HPI    Objective:   Physical Exam BP 162/93  Pulse 66  Ht 6' (1.829 m)  Wt 226 lb (102.513 kg)  BMI 30.64 kg/m2  Gen: middle aged m, non ill appearing, pleasant HEENT: NCAT, very swollen nasal turbinates that appear boggy, no sinus pressure, no middle ear effusion, OP clear and moist Lymphatics: Lymph node of right neck and angle of  mandible approx 5 mm, non tender; no axillary, trochlear, supraclavicular nodes CV: RRR Lungs: CTA-B    Assessment & Plan:

## 2013-05-21 NOTE — Patient Instructions (Signed)
Mr. Kyle Dominguez,   Sorry you are feeling well. Start antibiotic is not improved in 48 hours or he develop any fever. If you start antibiotic, then complete the entire course. I will make a referral for evaluation of the lymph node, but do not think it is anything concerning.  Sincerely,  Dr. Clinton SawyerWilliamson

## 2013-05-23 ENCOUNTER — Encounter: Payer: Self-pay | Admitting: Family Medicine

## 2013-05-23 DIAGNOSIS — R590 Localized enlarged lymph nodes: Secondary | ICD-10-CM | POA: Insufficient documentation

## 2013-05-23 NOTE — Assessment & Plan Note (Addendum)
Elevated BP today. Hx of intermittently high BP not on treatment. Confounding factors today include decongestant use and stress. Will send pt a letter and ask him to f/u in 2 weeks.

## 2013-05-23 NOTE — Assessment & Plan Note (Signed)
A: isolated lymph node < 1 cm without b symptoms is not particularly concerning to me, but the patient is very distressed by it P: given duration of time > 4 months, I will refer to IR for consideration of biopsy

## 2013-05-23 NOTE — Assessment & Plan Note (Signed)
A: This may be viral vs allergic. Not responding as expected if truly allergic.  P:  - Counseled pt on expected course of viral sinusitis - Given anti-biotics (augmentin) to start if symptoms lasts > 7 days or he develops fever; specifically discussed risks and benefits of antibiotics and enforced that the entire course needs to be taken if started; pt voiced understanding

## 2013-09-27 ENCOUNTER — Ambulatory Visit: Payer: Managed Care, Other (non HMO) | Admitting: Family Medicine

## 2013-10-03 ENCOUNTER — Ambulatory Visit: Payer: Managed Care, Other (non HMO) | Admitting: Family Medicine

## 2014-03-04 ENCOUNTER — Encounter: Payer: Self-pay | Admitting: Family Medicine

## 2014-03-04 ENCOUNTER — Encounter (HOSPITAL_COMMUNITY): Payer: Self-pay | Admitting: Family Medicine

## 2014-03-04 ENCOUNTER — Emergency Department (HOSPITAL_COMMUNITY): Payer: BLUE CROSS/BLUE SHIELD

## 2014-03-04 ENCOUNTER — Emergency Department (HOSPITAL_COMMUNITY)
Admission: EM | Admit: 2014-03-04 | Discharge: 2014-03-04 | Disposition: A | Discharge status: Home / Self Care | Payer: BLUE CROSS/BLUE SHIELD | Attending: Emergency Medicine | Admitting: Emergency Medicine

## 2014-03-04 ENCOUNTER — Ambulatory Visit (INDEPENDENT_AMBULATORY_CARE_PROVIDER_SITE_OTHER): Payer: BLUE CROSS/BLUE SHIELD | Admitting: Family Medicine

## 2014-03-04 VITALS — BP 139/89 | HR 67 | Temp 98.1°F | Ht 72.0 in | Wt 225.0 lb

## 2014-03-04 DIAGNOSIS — Z79899 Other long term (current) drug therapy: Secondary | ICD-10-CM | POA: Insufficient documentation

## 2014-03-04 DIAGNOSIS — Z8679 Personal history of other diseases of the circulatory system: Secondary | ICD-10-CM | POA: Insufficient documentation

## 2014-03-04 DIAGNOSIS — I10 Essential (primary) hypertension: Secondary | ICD-10-CM | POA: Diagnosis not present

## 2014-03-04 DIAGNOSIS — Z792 Long term (current) use of antibiotics: Secondary | ICD-10-CM | POA: Insufficient documentation

## 2014-03-04 DIAGNOSIS — R112 Nausea with vomiting, unspecified: Secondary | ICD-10-CM | POA: Diagnosis not present

## 2014-03-04 DIAGNOSIS — R1031 Right lower quadrant pain: Secondary | ICD-10-CM

## 2014-03-04 DIAGNOSIS — Z791 Long term (current) use of non-steroidal anti-inflammatories (NSAID): Secondary | ICD-10-CM | POA: Diagnosis not present

## 2014-03-04 DIAGNOSIS — Z8781 Personal history of (healed) traumatic fracture: Secondary | ICD-10-CM | POA: Insufficient documentation

## 2014-03-04 LAB — COMPREHENSIVE METABOLIC PANEL
ALT: 28 U/L (ref 0–53)
AST: 28 U/L (ref 0–37)
Albumin: 4.2 g/dL (ref 3.5–5.2)
Alkaline Phosphatase: 81 U/L (ref 39–117)
Anion gap: 10 (ref 5–15)
BUN: 11 mg/dL (ref 6–23)
CO2: 22 mmol/L (ref 19–32)
Calcium: 8.9 mg/dL (ref 8.4–10.5)
Chloride: 107 mEq/L (ref 96–112)
Creatinine, Ser: 1.18 mg/dL (ref 0.50–1.35)
GFR calc non Af Amer: 74 mL/min — ABNORMAL LOW (ref >90)
GFR, EST AFRICAN AMERICAN: 85 mL/min — AB (ref >90)
GLUCOSE: 93 mg/dL (ref 70–99)
Potassium: 4.2 mmol/L (ref 3.5–5.1)
Sodium: 139 mmol/L (ref 135–145)
Total Bilirubin: 0.7 mg/dL (ref 0.3–1.2)
Total Protein: 7.7 g/dL (ref 6.0–8.3)

## 2014-03-04 LAB — CBC WITH DIFFERENTIAL/PLATELET
Basophils Absolute: 0 10*3/uL (ref 0.0–0.1)
Basophils Relative: 0 % (ref 0–1)
Eosinophils Absolute: 0.2 10*3/uL (ref 0.0–0.7)
Eosinophils Relative: 3 % (ref 0–5)
HCT: 43.2 % (ref 39.0–52.0)
Hemoglobin: 14.1 g/dL (ref 13.0–17.0)
Lymphocytes Relative: 31 % (ref 12–46)
Lymphs Abs: 2 10*3/uL (ref 0.7–4.0)
MCH: 27.1 pg (ref 26.0–34.0)
MCHC: 32.6 g/dL (ref 30.0–36.0)
MCV: 82.9 fL (ref 78.0–100.0)
MONO ABS: 0.5 10*3/uL (ref 0.1–1.0)
Monocytes Relative: 8 % (ref 3–12)
NEUTROS PCT: 58 % (ref 43–77)
Neutro Abs: 3.7 10*3/uL (ref 1.7–7.7)
PLATELETS: 222 10*3/uL (ref 150–400)
RBC: 5.21 MIL/uL (ref 4.22–5.81)
RDW: 13.6 % (ref 11.5–15.5)
WBC: 6.4 10*3/uL (ref 4.0–10.5)

## 2014-03-04 LAB — URINALYSIS, ROUTINE W REFLEX MICROSCOPIC
Bilirubin Urine: NEGATIVE
GLUCOSE, UA: NEGATIVE mg/dL
Hgb urine dipstick: NEGATIVE
KETONES UR: NEGATIVE mg/dL
LEUKOCYTES UA: NEGATIVE
NITRITE: NEGATIVE
PROTEIN: NEGATIVE mg/dL
Specific Gravity, Urine: 1.013 (ref 1.005–1.030)
Urobilinogen, UA: 0.2 mg/dL (ref 0.0–1.0)
pH: 7 (ref 5.0–8.0)

## 2014-03-04 MED ORDER — IOHEXOL 300 MG/ML  SOLN
25.0000 mL | Freq: Once | INTRAMUSCULAR | Status: AC | PRN
  Administered 2014-03-04: 25 mL via ORAL

## 2014-03-04 MED ORDER — HYDROCODONE-ACETAMINOPHEN 5-325 MG PO TABS
1.0000 | ORAL_TABLET | ORAL | Status: DC | PRN
Start: 2014-03-04 — End: 2014-11-23

## 2014-03-04 MED ORDER — ONDANSETRON HCL 4 MG PO TABS: 4.0000 mg | ORAL_TABLET | Freq: Three times a day (TID) | ORAL | Status: DC | PRN

## 2014-03-04 MED ORDER — ONDANSETRON HCL 4 MG/2ML IJ SOLN
4.0000 mg | Freq: Once | INTRAMUSCULAR | Status: AC
  Administered 2014-03-04: 4 mg via INTRAVENOUS
  Filled 2014-03-04: qty 2

## 2014-03-04 MED ORDER — IOHEXOL 300 MG/ML  SOLN
100.0000 mL | Freq: Once | INTRAMUSCULAR | Status: AC | PRN
  Administered 2014-03-04: 100 mL via INTRAVENOUS

## 2014-03-04 MED ORDER — MORPHINE SULFATE 4 MG/ML IJ SOLN
4.0000 mg | Freq: Once | INTRAMUSCULAR | Status: AC
  Administered 2014-03-04: 4 mg via INTRAVENOUS
  Filled 2014-03-04: qty 1

## 2014-03-04 MED ORDER — SODIUM CHLORIDE 0.9 % IV BOLUS (SEPSIS)
1000.0000 mL | Freq: Once | INTRAVENOUS | Status: AC
  Administered 2014-03-04: 1000 mL via INTRAVENOUS

## 2014-03-04 NOTE — Assessment & Plan Note (Signed)
Patient with RLQ abdominal pain associated with nausea and vomiting most concerning for appendicitis. He needs evaluation in the ED for this issue. He is hemodynamically stable at this time and was felt to be appropriate for private car transport to the ED for further evaluation. He was advised of this and the charge nurse in the ED was made aware that he was coming to the ED.   Precepted with Dr Lum BabeEniola

## 2014-03-04 NOTE — ED Notes (Signed)
Patient transported to CT 

## 2014-03-04 NOTE — Patient Instructions (Signed)
Nice to meet you. Your pain is concerning for appendicitis. You need to go to the ED for further evaluation.

## 2014-03-04 NOTE — ED Notes (Signed)
Pt sent here from doctor for lower abd pain that started last night. sts N,V.

## 2014-03-04 NOTE — Progress Notes (Signed)
Patient ID: Kyle Dominguez, male   DOB: 08-Nov-1970, 44 y.o.   MRN: [id number]  Marikay AlarEric Hawke Villalpando, MD Phone: [phone]  Kyle PianRodney T Dowe is a 44 y.o. male who presents today for same day appointment.  Abdominal pain: notes started last night with sharp pain just inferior to the umbilicus and RLQ pain. Did not radiate. Is associated with vomiting of brown material at least 6 times since last night and nausea. No diarrhea. No fever. No dysuria. Not better with BM. Has not had this previously. Still has appendix and gall bladder. No history of abdominal surgery. Has not been able to eat and drink well. Has history of reflux though this feels very different. Notes he ate at [employer] on Saturday morning, though no one else in the group has gotten sick.   Patient is a nonsmoker.   ROS: Per HPI   Physical Exam Filed Vitals:   03/04/14 0956  BP: 139/89  Pulse: 67  Temp: 98.1 F (36.7 C)   Gen: appears uncomfortable Lungs: CTABL Nl WOB Heart: RRR  Abd: soft, TTP in suprapubic and RLQ, there is mild guarding, no rebound, + BS   Assessment/Plan: Please see individual problem list.  Marikay AlarEric Lilas Diefendorf, MD [facility] PGY[facility]

## 2014-03-04 NOTE — Discharge Instructions (Signed)
Read the information below.  Use the prescribed medication as directed.  Please discuss all new medications with your pharmacist.  Do not take additional tylenol while taking the prescribed pain medication to avoid overdose.  You may return to the Emergency Department at any time for worsening condition or any new symptoms that concern you.  If you develop high fevers, worsening abdominal pain, uncontrolled vomiting, or are unable to tolerate fluids by mouth, return to the ER for a recheck.   ° °Abdominal Pain °Many things can cause abdominal pain. Usually, abdominal pain is not caused by a disease and will improve without treatment. It can often be observed and treated at home. Your health care provider will do a physical exam and possibly order blood tests and X-rays to help determine the seriousness of your pain. However, in many cases, more time must pass before a clear cause of the pain can be found. Before that point, your health care provider may not know if you need more testing or further treatment. °HOME CARE INSTRUCTIONS  °Monitor your abdominal pain for any changes. The following actions may help to alleviate any discomfort you are experiencing: °· Only take over-the-counter or prescription medicines as directed by your health care provider. °· Do not take laxatives unless directed to do so by your health care provider. °· Try a clear liquid diet (broth, tea, or water) as directed by your health care provider. Slowly move to a bland diet as tolerated. °SEEK MEDICAL CARE IF: °· You have unexplained abdominal pain. °· You have abdominal pain associated with nausea or diarrhea. °· You have pain when you urinate or have a bowel movement. °· You experience abdominal pain that wakes you in the night. °· You have abdominal pain that is worsened or improved by eating food. °· You have abdominal pain that is worsened with eating fatty foods. °· You have a fever. °SEEK IMMEDIATE MEDICAL CARE IF:  °· Your pain does  not go away within 2 hours. °· You keep throwing up (vomiting). °· Your pain is felt only in portions of the abdomen, such as the right side or the left lower portion of the abdomen. °· You pass bloody or black tarry stools. °MAKE SURE YOU: °· Understand these instructions.   °· Will watch your condition.   °· Will get help right away if you are not doing well or get worse.   °Document Released: 11/18/2004 Document Revised: 02/13/2013 Document Reviewed: 10/18/2012 °ExitCare® Patient Information ©2015 [employer]. This information is not intended to replace advice given to you by your health care provider. Make sure you discuss any questions you have with your health care provider. ° °

## 2014-03-04 NOTE — ED Notes (Signed)
Spoke with CT advised pt has completed contrast.

## 2014-03-04 NOTE — ED Provider Notes (Signed)
CSN: [id number]     Arrival date & time 03/04/14  1023 History   First MD Initiated Contact with Patient 03/04/14 1237     Chief Complaint  Patient presents with  . Abdominal Pain     (Consider location/radiation/quality/duration/timing/severity/associated sxs/prior Treatment) HPI  Pt presents with RLQ pain, N/V since last night.  RLQ pain is sharp and aching, 8/10 intensity, worse with movement, no radiation, no meds tried at home.  Emesis is brown.  Had normal BM today.  Denies fevers, urinary symptoms, testicular pain or swelling, penile discharge.  [place] [employer] [place], unsure if others are sick.  NO other recent abnormal foods.  No hx abdominal surgeries.    Past Medical History  Diagnosis Date  . Hx of migraines   . Hypertension   . FRACTURE, RIB, LEFT 02/18/2010    Pt with recent trauma to ribs with subsequent fracture. Incident was work related. Is seeing workman's comp appointment MD about incident. [facility]     History reviewed. No pertinent past surgical history. History reviewed. No pertinent family history. History  Substance Use Topics  . Smoking status: Never Smoker   . Smokeless tobacco: Never Used  . Alcohol Use: Yes    Review of Systems  All other systems reviewed and are negative.     Allergies  Review of patient's allergies indicates no known allergies.  Home Medications   Prior to Admission medications   Medication Sig Start Date End Date Taking? Authorizing Provider  amoxicillin-clavulanate (AUGMENTIN) 875-125 MG per tablet Take 1 tablet by mouth 2 (two) times daily. 05/21/13   Garnetta BuddyEdward Williamson V, MD  famotidine (PEPCID) 20 MG tablet Take 1 tablet (20 mg total) by mouth 2 (two) times daily. 01/27/13   Vanetta MuldersScott Zackowski, MD  ibuprofen (ADVIL,MOTRIN) 800 MG tablet Take 1 tablet (800 mg total) by mouth 3 (three) times daily. 01/23/13   Hannah Muthersbaugh, PA-C  Multiple Vitamins-Minerals (CENTRUM PO) Take  1 tablet by mouth daily.    Historical Provider, MD  vitamin C (ASCORBIC ACID) 500 MG tablet Take 1,000 mg by mouth daily.    Historical Provider, MD   BP 133/91 mmHg  Pulse 61  Temp(Src) 98.6 F (37 C)  Resp 18  SpO2 99% Physical Exam  Constitutional: He appears well-developed and well-nourished. No distress.  HENT:  Head: Normocephalic and atraumatic.  Neck: Neck supple.  Cardiovascular: Normal rate and regular rhythm.   Pulmonary/Chest: Effort normal and breath sounds normal. No respiratory distress. He has no wheezes. He has no rales.  Abdominal: Soft. Normal appearance and bowel sounds are normal. He exhibits no distension and no mass. There is tenderness in the right lower quadrant. There is tenderness at McBurney's point. There is no rigidity, no rebound, no guarding and negative Murphy's sign. No hernia. Hernia confirmed negative in the right inguinal area and confirmed negative in the left inguinal area.  +Rovsings sign Negative psoas and obturator   Genitourinary: Testes normal and penis normal. Right testis shows no mass, no swelling and no tenderness. Right testis is descended. Left testis shows no mass, no swelling and no tenderness. Left testis is descended.  Lymphadenopathy:       Right: No inguinal adenopathy present.       Left: No inguinal adenopathy present.  Neurological: He is alert. He exhibits normal muscle tone.  Skin: He is not diaphoretic.  Nursing note and vitals reviewed.   ED Course  Procedures (including critical  care time) Labs Review Labs Reviewed  COMPREHENSIVE METABOLIC PANEL - Abnormal; Notable for the following:    GFR calc non Af Amer 74 (*)    GFR calc Af Amer 85 (*)    All other components within normal limits  CBC WITH DIFFERENTIAL  URINALYSIS, ROUTINE W REFLEX MICROSCOPIC    Imaging Review Ct Abdomen Pelvis W Contrast  03/04/2014   CLINICAL DATA:  Right lower quadrant pain with nausea and vomiting for 2 days.  EXAM: CT ABDOMEN AND  PELVIS WITH CONTRAST  TECHNIQUE: Multidetector CT imaging of the abdomen and pelvis was performed using the standard protocol following bolus administration of intravenous contrast.  CONTRAST:  100 mL OMNIPAQUE IOHEXOL 300 MG/ML  SOLN  COMPARISON:  None.  FINDINGS: There is some dependent atelectasis in the lung bases. No pleural or pericardial effusion.  The liver, gallbladder, spleen, adrenal glands, pancreas, biliary tree and left kidney appear normal. A small cyst in the lower pole of the right kidney is incidentally noted.  The appendix is well seen and appears normal. The stomach and small and large bowel are unremarkable. There is no lymphadenopathy or fluid. No lytic or sclerotic bony lesion is identified.  IMPRESSION: Negative for appendicitis or other acute abnormality. No finding to explain the patient's symptoms.   Electronically Signed   By: Drusilla Kanner M.D.   On: 03/04/2014 16:17     EKG Interpretation None      MDM   Final diagnoses:  RLQ abdominal pain  Non-intractable vomiting with nausea, vomiting of unspecified type    Afebrile nontoxic patient with RLQ pain, N/V.  Normal testicular exam.  No urinary symptoms.  Labs unremarkable.  UA unremarkable.  CT abd/pelvis negative.  D/c home with norco, zofran.  PCP follow up.  Discussed result, findings, treatment, and follow up  with patient.  Pt given return precautions.  Pt verbalizes understanding and agrees with plan.         Trixie Dredge, PA-C 03/04/14 1625  Nelia Shi, MD 03/05/14 [phone]

## 2014-03-05 ENCOUNTER — Encounter (HOSPITAL_BASED_OUTPATIENT_CLINIC_OR_DEPARTMENT_OTHER): Payer: Self-pay | Admitting: Emergency Medicine

## 2014-11-22 ENCOUNTER — Encounter (HOSPITAL_COMMUNITY): Payer: Self-pay | Admitting: Emergency Medicine

## 2014-11-22 DIAGNOSIS — K5732 Diverticulitis of large intestine without perforation or abscess without bleeding: Secondary | ICD-10-CM | POA: Diagnosis not present

## 2014-11-22 DIAGNOSIS — R109 Unspecified abdominal pain: Secondary | ICD-10-CM | POA: Diagnosis present

## 2014-11-22 DIAGNOSIS — Z8781 Personal history of (healed) traumatic fracture: Secondary | ICD-10-CM | POA: Insufficient documentation

## 2014-11-22 DIAGNOSIS — Z791 Long term (current) use of non-steroidal anti-inflammatories (NSAID): Secondary | ICD-10-CM | POA: Insufficient documentation

## 2014-11-22 DIAGNOSIS — I10 Essential (primary) hypertension: Secondary | ICD-10-CM | POA: Diagnosis not present

## 2014-11-22 NOTE — ED Notes (Signed)
Pt. reports left abdominal pain with emesis onset this morning , denies diarrhea , no fever or chills.  

## 2014-11-23 ENCOUNTER — Emergency Department (HOSPITAL_COMMUNITY): Payer: BLUE CROSS/BLUE SHIELD

## 2014-11-23 ENCOUNTER — Encounter (HOSPITAL_COMMUNITY): Payer: Self-pay | Admitting: Radiology

## 2014-11-23 ENCOUNTER — Emergency Department (HOSPITAL_COMMUNITY)
Admission: EM | Admit: 2014-11-23 | Discharge: 2014-11-23 | Disposition: A | Discharge status: Home / Self Care | Payer: BLUE CROSS/BLUE SHIELD | Attending: Emergency Medicine | Admitting: Emergency Medicine

## 2014-11-23 DIAGNOSIS — K5732 Diverticulitis of large intestine without perforation or abscess without bleeding: Secondary | ICD-10-CM

## 2014-11-23 LAB — COMPREHENSIVE METABOLIC PANEL
ALT: 19 U/L (ref 17–63)
AST: 22 U/L (ref 15–41)
Albumin: 4.1 g/dL (ref 3.5–5.0)
Alkaline Phosphatase: 91 U/L (ref 38–126)
Anion gap: 9 (ref 5–15)
BUN: 10 mg/dL (ref 6–20)
CHLORIDE: 102 mmol/L (ref 101–111)
CO2: 27 mmol/L (ref 22–32)
Calcium: 9.5 mg/dL (ref 8.9–10.3)
Creatinine, Ser: 1.26 mg/dL — ABNORMAL HIGH (ref 0.61–1.24)
GFR calc Af Amer: >60 mL/min (ref >60)
GFR calc non Af Amer: >60 mL/min (ref >60)
Glucose, Bld: 101 mg/dL — ABNORMAL HIGH (ref 65–99)
POTASSIUM: 4 mmol/L (ref 3.5–5.1)
SODIUM: 138 mmol/L (ref 135–145)
Total Bilirubin: 1.3 mg/dL — ABNORMAL HIGH (ref 0.3–1.2)
Total Protein: 7.8 g/dL (ref 6.5–8.1)

## 2014-11-23 LAB — CBC
HEMATOCRIT: 43.6 % (ref 39.0–52.0)
Hemoglobin: 14.3 g/dL (ref 13.0–17.0)
MCH: 27.5 pg (ref 26.0–34.0)
MCHC: 32.8 g/dL (ref 30.0–36.0)
MCV: 83.8 fL (ref 78.0–100.0)
PLATELETS: 206 10*3/uL (ref 150–400)
RBC: 5.2 MIL/uL (ref 4.22–5.81)
RDW: 13.2 % (ref 11.5–15.5)
WBC: 11.2 10*3/uL — ABNORMAL HIGH (ref 4.0–10.5)

## 2014-11-23 LAB — URINALYSIS, ROUTINE W REFLEX MICROSCOPIC
Bilirubin Urine: NEGATIVE
GLUCOSE, UA: NEGATIVE mg/dL
Hgb urine dipstick: NEGATIVE
KETONES UR: NEGATIVE mg/dL
Leukocytes, UA: NEGATIVE
Nitrite: NEGATIVE
Protein, ur: NEGATIVE mg/dL
SPECIFIC GRAVITY, URINE: 1.019 (ref 1.005–1.030)
Urobilinogen, UA: 0.2 mg/dL (ref 0.0–1.0)
pH: 5.5 (ref 5.0–8.0)

## 2014-11-23 LAB — LIPASE, BLOOD: LIPASE: 21 U/L — AB (ref 22–51)

## 2014-11-23 MED ORDER — IOHEXOL 300 MG/ML  SOLN
100.0000 mL | Freq: Once | INTRAMUSCULAR | Status: AC | PRN
  Administered 2014-11-23: 100 mL via INTRAVENOUS

## 2014-11-23 MED ORDER — METRONIDAZOLE IN NACL 5-0.79 MG/ML-% IV SOLN
500.0000 mg | Freq: Once | INTRAVENOUS | Status: AC
  Administered 2014-11-23: 500 mg via INTRAVENOUS
  Filled 2014-11-23: qty 100

## 2014-11-23 MED ORDER — METRONIDAZOLE 500 MG PO TABS: 500.0000 mg | ORAL_TABLET | Freq: Two times a day (BID) | ORAL | Status: DC

## 2014-11-23 MED ORDER — CIPROFLOXACIN HCL 500 MG PO TABS
500.0000 mg | ORAL_TABLET | Freq: Two times a day (BID) | ORAL | Status: DC
Start: 2014-11-23 — End: 2015-05-01

## 2014-11-23 MED ORDER — ONDANSETRON HCL 4 MG/2ML IJ SOLN
4.0000 mg | Freq: Once | INTRAMUSCULAR | Status: AC
  Administered 2014-11-23: 4 mg via INTRAVENOUS
  Filled 2014-11-23: qty 2

## 2014-11-23 MED ORDER — ONDANSETRON HCL 4 MG PO TABS: 4.0000 mg | ORAL_TABLET | Freq: Four times a day (QID) | ORAL | Status: DC

## 2014-11-23 MED ORDER — HYDROCODONE-ACETAMINOPHEN 5-325 MG PO TABS: 1.0000 | ORAL_TABLET | ORAL | Status: DC | PRN

## 2014-11-23 MED ORDER — SODIUM CHLORIDE 0.9 % IV BOLUS (SEPSIS)
1000.0000 mL | Freq: Once | INTRAVENOUS | Status: AC
  Administered 2014-11-23: 1000 mL via INTRAVENOUS

## 2014-11-23 MED ORDER — LEVOFLOXACIN IN D5W 750 MG/150ML IV SOLN
750.0000 mg | Freq: Once | INTRAVENOUS | Status: AC
  Administered 2014-11-23: 750 mg via INTRAVENOUS
  Filled 2014-11-23: qty 150

## 2014-11-23 MED ORDER — HYDROMORPHONE HCL 1 MG/ML IJ SOLN
1.0000 mg | Freq: Once | INTRAMUSCULAR | Status: AC
  Administered 2014-11-23: 1 mg via INTRAVENOUS
  Filled 2014-11-23: qty 1

## 2014-11-23 NOTE — ED Notes (Signed)
MD at bedside. 

## 2014-11-23 NOTE — Discharge Instructions (Signed)

## 2014-11-23 NOTE — ED Provider Notes (Signed)
CSN: [id number]     Arrival date & time 11/22/14  2314 History  By signing my name below, I, Kyle Dominguez, attest that this documentation has been prepared under the direction and in the presence of Arby Barrette, MD. Electronically Signed: Doreatha Dominguez, ED Scribe. 11/23/2014. 2:39 AM.    Chief Complaint  Patient presents with  . Abdominal Pain   The history is provided by the patient. No language interpreter was used.   HPI Comments: Kyle Dominguez is a 44 y.o. male with Hx of HTN who presents to the Emergency Department complaining of moderate, sharp, non-radiating left-sided abdominal pain onset yesterday at 11AM. He states associated nausea, 5 episodes of emesis today, chills, diaphoresis. Pt states that pain is not exacerbated by movement. No Hx of similar symptoms. No Hx of diverticulitis, renal calculi, abdominal surgery, hernias. No FHx of renal calculi. No recent falls, trauma, twisting or injury. He denies dysuria, difficulty urinating, hematuria, cough, CP, SOB, testicular pain or swelling, leg swelling.    Past Medical History  Diagnosis Date  . Hx of migraines   . Hypertension   . FRACTURE, RIB, LEFT 02/18/2010    Pt with recent trauma to ribs with subsequent fracture. Incident was work related. Is seeing workman's comp appointment MD about incident. Deferring care to [facility] comp physician.     History reviewed. No pertinent past surgical history. No family history on file. Social History  Substance Use Topics  . Smoking status: Never Smoker   . Smokeless tobacco: Never Used  . Alcohol Use: Yes    Review of Systems 10 Systems reviewed and are negative for acute change except as noted in the HPI.   Allergies  Review of patient's allergies indicates no known allergies.  Home Medications   Prior to Admission medications   Medication Sig Start Date End Date Taking? Authorizing Provider  ibuprofen (ADVIL,MOTRIN) 800 MG tablet Take 1 tablet (800 mg total) by mouth 3  (three) times daily. 01/23/13  Yes Hannah Muthersbaugh, PA-C  ciprofloxacin (CIPRO) 500 MG tablet Take 1 tablet (500 mg total) by mouth 2 (two) times daily. 11/23/14   Arby Barrette, MD  HYDROcodone-acetaminophen (NORCO/VICODIN) 5-325 MG tablet Take 1-2 tablets by mouth every 4 (four) hours as needed for moderate pain or severe pain. 11/23/14   Arby Barrette, MD  metroNIDAZOLE (FLAGYL) 500 MG tablet Take 1 tablet (500 mg total) by mouth 2 (two) times daily. 11/23/14   Arby Barrette, MD  ondansetron (ZOFRAN) 4 MG tablet Take 1 tablet (4 mg total) by mouth every 6 (six) hours. 11/23/14   Arby Barrette, MD   BP 136/81 mmHg  Pulse 83  Temp(Src) 100.3 F (37.9 C) (Oral)  Resp 16  Ht 6' (1.829 m)  Wt 206 lb (93.441 kg)  BMI 27.93 kg/m2  SpO2 99% Physical Exam  Constitutional: He is oriented to person, place, and time. He appears well-developed and well-nourished.  HENT:  Head: Normocephalic and atraumatic.  Eyes: EOM are normal. Pupils are equal, round, and reactive to light.  Neck: Neck supple.  Cardiovascular: Normal rate, regular rhythm, normal heart sounds and intact distal pulses.   Pulmonary/Chest: Effort normal and breath sounds normal.  Abdominal: Soft. Bowel sounds are normal. He exhibits no distension. There is tenderness.  Moderate to severe left lower quadrant and left lateral quadrant tenderness to palpation. No CVA tenderness.  Musculoskeletal: Normal range of motion. He exhibits no edema.  Neurological: He is alert and oriented to person, place, and time. He has  normal strength. Coordination normal. GCS eye subscore is 4. GCS verbal subscore is 5. GCS motor subscore is 6.  Skin: Skin is warm, dry and intact.  Psychiatric: He has a normal mood and affect.    ED Course  Procedures (including critical care time) DIAGNOSTIC STUDIES: Oxygen Saturation is 97% on RA, normal by my interpretation.    COORDINATION OF CARE: 2:37 AM Discussed treatment plan with pt at bedside and pt  agreed to plan.   Labs Review Labs Reviewed  LIPASE, BLOOD - Abnormal; Notable for the following:    Lipase 21 (*)    All other components within normal limits  COMPREHENSIVE METABOLIC PANEL - Abnormal; Notable for the following:    Glucose, Bld 101 (*)    Creatinine, Ser 1.26 (*)    Total Bilirubin 1.3 (*)    All other components within normal limits  CBC - Abnormal; Notable for the following:    WBC 11.2 (*)    All other components within normal limits  URINALYSIS, ROUTINE W REFLEX MICROSCOPIC (NOT AT [facility])    Imaging Review Ct Abdomen Pelvis W Contrast  11/23/2014   CLINICAL DATA:  Left lower quadrant pain  EXAM: CT ABDOMEN AND PELVIS WITH CONTRAST  TECHNIQUE: Multidetector CT imaging of the abdomen and pelvis was performed using the standard protocol following bolus administration of intravenous contrast.  CONTRAST:  OMNIPAQUE IOHEXOL 300 MG/ML  SOLN  COMPARISON:  03/04/2014  FINDINGS: Lower chest and abdominal wall: Mild fatty expansion of the left inguinal canal.  Hepatobiliary: Few sub cm low densities in the liver have a stable, benign appearance.No evidence of biliary obstruction or stone.  Pancreas: Unremarkable.  Spleen: Unremarkable.  Adrenals/Urinary Tract: Negative adrenals. No hydronephrosis or stone. Sub cm cysts in the lower right kidney. Unremarkable bladder.  Reproductive:No pathologic findings.  Stomach/Bowel: Focal fat inflammation and wall thickening around a descending colon diverticulum. No abscess or pneumoperitoneum. No appendicitis.  Vascular/Lymphatic: No acute vascular abnormality. No mass or adenopathy.  Peritoneal: Small reactive ascites.  Musculoskeletal: No acute abnormalities.  IMPRESSION: Uncomplicated descending colon diverticulitis.   Electronically Signed   By: Marnee Spring M.D.   On: 11/23/2014 03:26   I have personally reviewed and evaluated these images and lab results as part of my medical decision-making.  MDM   Final diagnoses:   Diverticulitis of large intestine without perforation or abscess without bleeding   Patient presents with abdominal pain, left sided. CT scan shows diverticulitis without abscess or perforation. Patient is nontoxic and alert. He does not have other medical comorbidity. At this point he feel he is a safe candidate for home management. He and his wife counseled on signs and symptoms for which return. They're counseled on follow-up Monday or Tuesday for recheck. Patient will be discharged with Cipro and Flagyl as well as Vicodin and Zofran for symptomatic control.     Arby Barrette, MD 11/23/14 0530

## 2015-04-30 ENCOUNTER — Emergency Department (HOSPITAL_COMMUNITY)
Admission: EM | Admit: 2015-04-30 | Discharge: 2015-04-30 | Disposition: A | Discharge status: Home / Self Care | Payer: BLUE CROSS/BLUE SHIELD | Attending: Emergency Medicine | Admitting: Emergency Medicine

## 2015-04-30 ENCOUNTER — Encounter (HOSPITAL_COMMUNITY): Payer: Self-pay | Admitting: Emergency Medicine

## 2015-04-30 ENCOUNTER — Emergency Department (HOSPITAL_COMMUNITY): Payer: BLUE CROSS/BLUE SHIELD

## 2015-04-30 DIAGNOSIS — R05 Cough: Secondary | ICD-10-CM | POA: Diagnosis not present

## 2015-04-30 DIAGNOSIS — I10 Essential (primary) hypertension: Secondary | ICD-10-CM | POA: Insufficient documentation

## 2015-04-30 DIAGNOSIS — R61 Generalized hyperhidrosis: Secondary | ICD-10-CM | POA: Diagnosis not present

## 2015-04-30 DIAGNOSIS — R079 Chest pain, unspecified: Secondary | ICD-10-CM | POA: Insufficient documentation

## 2015-04-30 DIAGNOSIS — R0602 Shortness of breath: Secondary | ICD-10-CM | POA: Insufficient documentation

## 2015-04-30 LAB — CBC
HEMATOCRIT: 43.2 % (ref 39.0–52.0)
Hemoglobin: 14.3 g/dL (ref 13.0–17.0)
MCH: 27.7 pg (ref 26.0–34.0)
MCHC: 33.1 g/dL (ref 30.0–36.0)
MCV: 83.6 fL (ref 78.0–100.0)
PLATELETS: 229 10*3/uL (ref 150–400)
RBC: 5.17 MIL/uL (ref 4.22–5.81)
RDW: 13.2 % (ref 11.5–15.5)
WBC: 5.9 10*3/uL (ref 4.0–10.5)

## 2015-04-30 LAB — BASIC METABOLIC PANEL
Anion gap: 13 (ref 5–15)
BUN: 17 mg/dL (ref 6–20)
CO2: 24 mmol/L (ref 22–32)
CREATININE: 1.25 mg/dL — AB (ref 0.61–1.24)
Calcium: 9.7 mg/dL (ref 8.9–10.3)
Chloride: 105 mmol/L (ref 101–111)
GFR calc Af Amer: >60 mL/min (ref >60)
GLUCOSE: 90 mg/dL (ref 65–99)
POTASSIUM: 4 mmol/L (ref 3.5–5.1)
SODIUM: 142 mmol/L (ref 135–145)

## 2015-04-30 LAB — I-STAT TROPONIN, ED: Troponin i, poc: 0.01 ng/mL (ref 0.00–0.08)

## 2015-04-30 NOTE — ED Notes (Signed)
Pt left without being seen.

## 2015-04-30 NOTE — ED Notes (Signed)
Pt. reports intermittent central chest pain ( sharp/tight) onset 3 weeks ago with SOB , occasional dry cough and mild diaphoresis .

## 2015-05-01 ENCOUNTER — Encounter (HOSPITAL_COMMUNITY): Payer: Self-pay

## 2015-05-01 ENCOUNTER — Observation Stay (HOSPITAL_COMMUNITY)
Admission: EM | Admit: 2015-05-01 | Discharge: 2015-05-03 | Disposition: A | Discharge status: Home / Self Care | Payer: BLUE CROSS/BLUE SHIELD | Attending: Internal Medicine | Admitting: Internal Medicine

## 2015-05-01 DIAGNOSIS — K029 Dental caries, unspecified: Secondary | ICD-10-CM | POA: Insufficient documentation

## 2015-05-01 DIAGNOSIS — K0381 Cracked tooth: Secondary | ICD-10-CM | POA: Diagnosis not present

## 2015-05-01 DIAGNOSIS — R0600 Dyspnea, unspecified: Secondary | ICD-10-CM | POA: Diagnosis not present

## 2015-05-01 DIAGNOSIS — G43909 Migraine, unspecified, not intractable, without status migrainosus: Secondary | ICD-10-CM | POA: Diagnosis not present

## 2015-05-01 DIAGNOSIS — I10 Essential (primary) hypertension: Secondary | ICD-10-CM | POA: Diagnosis present

## 2015-05-01 DIAGNOSIS — R0789 Other chest pain: Principal | ICD-10-CM | POA: Insufficient documentation

## 2015-05-01 DIAGNOSIS — R079 Chest pain, unspecified: Secondary | ICD-10-CM

## 2015-05-01 LAB — CBC WITH DIFFERENTIAL/PLATELET
Basophils Absolute: 0 10*3/uL (ref 0.0–0.1)
Basophils Relative: 0 %
Eosinophils Absolute: 0.2 10*3/uL (ref 0.0–0.7)
Eosinophils Relative: 4 %
HCT: 41.7 % (ref 39.0–52.0)
Hemoglobin: 14.1 g/dL (ref 13.0–17.0)
Lymphocytes Relative: 40 %
Lymphs Abs: 2.1 10*3/uL (ref 0.7–4.0)
MCH: 27.9 pg (ref 26.0–34.0)
MCHC: 33.8 g/dL (ref 30.0–36.0)
MCV: 82.4 fL (ref 78.0–100.0)
Monocytes Absolute: 0.3 10*3/uL (ref 0.1–1.0)
Monocytes Relative: 6 %
Neutro Abs: 2.5 10*3/uL (ref 1.7–7.7)
Neutrophils Relative %: 50 %
Platelets: 234 10*3/uL (ref 150–400)
RBC: 5.06 MIL/uL (ref 4.22–5.81)
RDW: 13.2 % (ref 11.5–15.5)
WBC: 5.2 10*3/uL (ref 4.0–10.5)

## 2015-05-01 LAB — BASIC METABOLIC PANEL
Anion gap: 8 (ref 5–15)
BUN: 19 mg/dL (ref 6–20)
CO2: 24 mmol/L (ref 22–32)
Calcium: 8.8 mg/dL — ABNORMAL LOW (ref 8.9–10.3)
Chloride: 107 mmol/L (ref 101–111)
Creatinine, Ser: 1.09 mg/dL (ref 0.61–1.24)
GFR calc Af Amer: >60 mL/min (ref >60)
GFR calc non Af Amer: >60 mL/min (ref >60)
Glucose, Bld: 108 mg/dL — ABNORMAL HIGH (ref 65–99)
Potassium: 4.4 mmol/L (ref 3.5–5.1)
Sodium: 139 mmol/L (ref 135–145)

## 2015-05-01 LAB — MRSA PCR SCREENING: MRSA BY PCR: NEGATIVE

## 2015-05-01 LAB — RAPID URINE DRUG SCREEN, HOSP PERFORMED
AMPHETAMINES: NOT DETECTED
BARBITURATES: NOT DETECTED
Benzodiazepines: NOT DETECTED
COCAINE: NOT DETECTED
Opiates: NOT DETECTED
TETRAHYDROCANNABINOL: POSITIVE — AB

## 2015-05-01 LAB — TSH: TSH: 0.706 u[IU]/mL (ref 0.350–4.500)

## 2015-05-01 LAB — TROPONIN I: Troponin I: <0.03 ng/mL (ref <0.031)

## 2015-05-01 LAB — BRAIN NATRIURETIC PEPTIDE: B Natriuretic Peptide: 13.3 pg/mL (ref 0.0–100.0)

## 2015-05-01 LAB — D-DIMER, QUANTITATIVE (NOT AT ARMC): D-Dimer, Quant: <0.27 ug/mL-FEU (ref 0.00–0.50)

## 2015-05-01 MED ORDER — DIPHENHYDRAMINE HCL 50 MG/ML IJ SOLN: 25.0000 mg | Freq: Once | INTRAMUSCULAR | Status: DC

## 2015-05-01 MED ORDER — HEPARIN SODIUM (PORCINE) 5000 UNIT/ML IJ SOLN
5000.0000 [IU] | Freq: Three times a day (TID) | INTRAMUSCULAR | Status: DC
  Administered 2015-05-01 – 2015-05-02 (×4): 5000 [IU] via SUBCUTANEOUS
  Filled 2015-05-01 (×7): qty 1

## 2015-05-01 MED ORDER — METOCLOPRAMIDE HCL 5 MG/ML IJ SOLN
10.0000 mg | Freq: Once | INTRAMUSCULAR | Status: AC
  Administered 2015-05-01: 10 mg via INTRAVENOUS
  Filled 2015-05-01: qty 2

## 2015-05-01 MED ORDER — ASPIRIN 81 MG PO CHEW
324.0000 mg | CHEWABLE_TABLET | Freq: Once | ORAL | Status: AC
  Administered 2015-05-01: 324 mg via ORAL
  Filled 2015-05-01: qty 4

## 2015-05-01 MED ORDER — ASPIRIN EC 81 MG PO TBEC
81.0000 mg | DELAYED_RELEASE_TABLET | Freq: Every day | ORAL | Status: DC
  Administered 2015-05-02 – 2015-05-03 (×2): 81 mg via ORAL
  Filled 2015-05-01 (×2): qty 1

## 2015-05-01 MED ORDER — SODIUM CHLORIDE 0.9% FLUSH
3.0000 mL | Freq: Two times a day (BID) | INTRAVENOUS | Status: DC
  Administered 2015-05-01 – 2015-05-02 (×4): 3 mL via INTRAVENOUS

## 2015-05-01 MED ORDER — KETOROLAC TROMETHAMINE 30 MG/ML IJ SOLN
30.0000 mg | Freq: Once | INTRAMUSCULAR | Status: AC
  Administered 2015-05-01: 30 mg via INTRAVENOUS
  Filled 2015-05-01: qty 1

## 2015-05-01 MED ORDER — NITROGLYCERIN 0.4 MG SL SUBL: 0.4000 mg | SUBLINGUAL_TABLET | SUBLINGUAL | Status: DC | PRN

## 2015-05-01 MED ORDER — SODIUM CHLORIDE 0.9% FLUSH
3.0000 mL | INTRAVENOUS | Status: DC | PRN
Start: 2015-05-01 — End: 2015-05-03

## 2015-05-01 MED ORDER — ACETAMINOPHEN 325 MG PO TABS
650.0000 mg | ORAL_TABLET | ORAL | Status: DC | PRN
Start: 2015-05-01 — End: 2015-05-02
  Administered 2015-05-01 – 2015-05-02 (×2): 650 mg via ORAL
  Filled 2015-05-01 (×2): qty 2

## 2015-05-01 MED ORDER — ONDANSETRON HCL 4 MG/2ML IJ SOLN
4.0000 mg | Freq: Four times a day (QID) | INTRAMUSCULAR | Status: DC | PRN
  Administered 2015-05-01: 4 mg via INTRAVENOUS
  Filled 2015-05-01: qty 2

## 2015-05-01 MED ORDER — ATORVASTATIN CALCIUM 40 MG PO TABS
40.0000 mg | ORAL_TABLET | Freq: Every day | ORAL | Status: DC
  Administered 2015-05-01 – 2015-05-02 (×2): 40 mg via ORAL
  Filled 2015-05-01 (×4): qty 1

## 2015-05-01 MED ORDER — HYDRALAZINE HCL 20 MG/ML IJ SOLN
10.0000 mg | INTRAMUSCULAR | Status: DC | PRN
  Administered 2015-05-01 (×2): 10 mg via INTRAVENOUS
  Filled 2015-05-01 (×3): qty 1

## 2015-05-01 MED ORDER — METOPROLOL TARTRATE 25 MG PO TABS
25.0000 mg | ORAL_TABLET | Freq: Two times a day (BID) | ORAL | Status: DC
  Administered 2015-05-01 – 2015-05-03 (×5): 25 mg via ORAL
  Filled 2015-05-01 (×5): qty 1

## 2015-05-01 MED ORDER — SODIUM CHLORIDE 0.9 % IV SOLN: 250.0000 mL | INTRAVENOUS | Status: DC | PRN

## 2015-05-01 NOTE — BH Assessment (Addendum)
Received a call from Berneice Heinrichina [facility](AC[facility]. [facility] Dr. Jannifer FranklinAkintayo and Julieanne CottonJosephine, NP. Nursing report # is [phone] [id number]. Patient is under IVC. Nursing staff updated regarding patient's disposition. Nursing staff will need to complete [facility] [employer].

## 2015-05-01 NOTE — ED Notes (Signed)
Pt very rudely stated that we were not sticking him again.  Told him we could try to pull from IV.  RN aware.

## 2015-05-01 NOTE — ED Notes (Signed)
Pt c/o intermittent, sharp, central chest pain x 2 weeks.  Pain score 6/10.  Pt reliefs taking OTC pain medication w/ "a little relief."  + SOB +Diaphoresis  Denies cardiac Hx.  [facility]

## 2015-05-01 NOTE — ED Provider Notes (Signed)
CSN: [id number]     Arrival date & time 05/01/15  0758 History   First MD Initiated Contact with Patient 05/01/15 [phone]     Chief Complaint  Patient presents with  . Chest Pain     (Consider location/radiation/quality/duration/timing/severity/associated sxs/prior Treatment) HPI  [age] year old male with chest pain. Intermittent pain to the left side of his chest. Onset about 2 weeks ago. Pain is sharp and sometimes dull. The center to the left side of his chest. Does not radiate. Sometimes associated with dyspnea and diaphoresis. No palpitations. No nausea. 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 No trauma. No unusual leg pain or swelling. No known history of coronary artery disease.  Past Medical History  Diagnosis Date  . Hx of migraines   . Hypertension   . FRACTURE, RIB, LEFT 02/18/2010    Pt with recent trauma to ribs with subsequent fracture. Incident was work related. Is seeing workman's comp appointment MD about incident. [facility]     History reviewed. No pertinent past surgical history. History reviewed. No pertinent family history. Social History  Substance Use Topics  . Smoking status: Never Smoker   . Smokeless tobacco: Never Used  . Alcohol Use: Yes    Review of Systems  All systems reviewed and negative, other than as noted in HPI.   Allergies  Review of patient's allergies indicates no known allergies.  Home Medications   Prior to Admission medications   Medication Sig Start Date End Date Taking? Authorizing Provider  Ibuprofen-Diphenhydramine Cit (ADVIL PM PO) Take 3 tablets by mouth at bedtime as needed (sleep/pain).   Yes Historical Provider, MD  naproxen sodium (ANAPROX) 220 MG tablet Take 440 mg by mouth 2 (two) times daily as needed (pain).   Yes Historical Provider, MD  Phenyleph-Doxylamine-DM-APAP (ALKA SELTZER PLUS PO) Take 2 tablets by mouth daily as needed (cold symptoms).   Yes  Historical Provider, MD   There were no vitals taken for this visit. Physical Exam  Constitutional: He appears well-developed and well-nourished. No distress.  HENT:  Head: Normocephalic and atraumatic.  Eyes: Conjunctivae are normal. Right eye exhibits no discharge. Left eye exhibits no discharge.  Neck: Neck supple.  Cardiovascular: Normal rate, regular rhythm and normal heart sounds.  Exam reveals no gallop and no friction rub.   No murmur heard. Pulmonary/Chest: Effort normal and breath sounds normal. No respiratory distress.  Abdominal: Soft. He exhibits no distension. There is no tenderness.  Musculoskeletal: He exhibits no edema or tenderness.  Lower extremities symmetric as compared to each other. No calf tenderness. Negative Homan's. No palpable cords.   Neurological: He is alert.  Skin: Skin is warm and dry.  Psychiatric: He has a normal mood and affect. His behavior is normal. Thought content normal.  Nursing note and vitals reviewed.   ED Course  Procedures (including critical care time) Labs Review Labs Reviewed  BASIC METABOLIC PANEL - Abnormal; Notable for the following:    Glucose, Bld 108 (*)    Calcium 8.8 (*)    All other components within normal limits  CBC WITH DIFFERENTIAL/PLATELET  TROPONIN I    Imaging Review Dg Chest 2 View  04/30/2015  CLINICAL DATA:  Initial evaluation for 3 week history of chest pain. EXAM: CHEST  2 VIEW COMPARISON:  Prior radiograph from 03/14/2013. FINDINGS: Cardiac and mediastinal silhouettes are stable in size and contour, and remain within normal limits. Mild tortuosity the intrathoracic aorta. Tracheal air column  midline and patent. Lungs are normally inflated. No focal infiltrate, pulmonary edema, or pleural effusion. No pneumothorax. No acute osseus abnormality. IMPRESSION: Stable appearance of the chest with no active cardiopulmonary disease identified. Electronically Signed   By: Rise Mu M.D.   On: 04/30/2015 20:27    I have personally reviewed and evaluated these images and lab results as part of my medical decision-making.   EKG Interpretation   Date/Time:  Thursday May 01 2015 08:07:12 EST Ventricular Rate:  65 PR Interval:  161 QRS Duration: 92 QT Interval:  413 QTC Calculation: 429 R Axis:   76 Text Interpretation:  Sinus rhythm Anteroseptal infarct, old No  significant change since last tracing Confirmed by Shemeika Starzyk  MD, Lucciana Head  ([id number]) on 05/01/2015 9:41:58 AM      MDM   Final diagnoses:  Chest pain, unspecified chest pain type    45 year old male with chest pain. Both typical and atypical features. He reports the pain has been constant since returned last night. Normal troponin this morning would argue against ACS.  Pain in the center his chest which feels tight/pressure like. Some concern that symptoms seem to be escalating. He also reports occasionally has some pain into his back and numbness in his left upper extremity. Also associated shortness of breath and diaphoresis. Reports "borderline" blood pressure. Hypertensive in ED. EKG fairly similar to previos.   Raeford Razor, MD 05/05/15 [phone]

## 2015-05-01 NOTE — H&P (Signed)
[facility] History and Physical  Kyle PianRodney T Whitter ZOX:[id number] DOB: November 10, 1970 DOA: 05/01/2015  Referring physician: ED physician PCP: Christ KickErvin,Alison M, PA  Specialists: None listed  Chief Complaint:  Chest pain, dyspnea  HPI: Kyle Dominguez is a 45 y.o. male with PMH of migraine headache and essential hypertension and presents to the ED with 3 weeks of intermittent chest pain and dyspnea. Patient had been in his usual state of fairly good health until approximately 3 weeks ago when he noted acute onset of pain in the central chest, accompanied by dyspnea. Over the ensuing days, episodes have continued to recur, sometimes with exertion, and often at rest. Pain is described as sharp in character, moderate in intensity, localized to a point just left of the central chest, and spontaneously resolving over the course of several minutes. Patient has been unable to identify alleviating or exacerbating factors.  He has never had similar symptoms previously. Patient is a nonsmoker with no known family history of premature heart disease. He does not take aspirin and is not known to have atherosclerotic disease. He has no history of diabetes or hyperlipidemia. He denies fevers, chills, cough, palpitations, or unilateral leg swelling or tenderness. Patient went into the emergency department for evaluation the night prior to his admission but left prior to being seen by a physician. There was a recurrence of pain overnight which persisted through the morning, prompting the patient to re-present.   In ED, patient was found to be afebrile, saturating well on room air, and with vital signs stable. EKG was obtained and demonstrates a normal sinus rhythm without acute ischemic changes. Initial troponin is undetectable, BMP is essentially normal, and CBC is unremarkable. D-dimer was obtained from the emergency department and returns lower than the limits of detection. A 324 mg aspirin chew was given to the patient  in the ED and he was monitored on telemetry with no ischemic changes. Mr. Verlin Grillsrotman remained hemodynamically stable in the ED and will be admitted for ongoing evaluation and management of chest pain.  Where does patient live?   At home     Can patient participate in ADLs?  Yes        Review of Systems:   General: no fevers, chills, sweats, weight change, poor appetite, or fatigue HEENT: no blurry vision, hearing changes or sore throat Pulm: no cough or wheeze. Dyspnea CV: no palpitations. Pain in central and left chest Abd: no nausea, vomiting, abdominal pain, diarrhea, or constipation GU: no dysuria, hematuria, increased urinary frequency, or urgency  Ext: no leg edema Neuro: no focal weakness, numbness, or tingling, no vision change or hearing loss Skin: no rash, no wounds MSK: No muscle spasm, no deformity, no red, hot, or swollen joint Heme: No easy bruising or bleeding Travel history: No recent long distant travel    Allergy: No Known Allergies  Past Medical History  Diagnosis Date  . Hx of migraines   . Hypertension   . FRACTURE, RIB, LEFT 02/18/2010    Pt with recent trauma to ribs with subsequent fracture. Incident was work related. Is seeing workman's comp appointment MD about incident. [facility]      History reviewed. No pertinent past surgical history.  Social History:  reports that he has never smoked. He has never used smokeless tobacco. He reports that he drinks alcohol. He reports that he does not use illicit drugs.  Family History: History reviewed. No pertinent family history.   Prior to Admission medications  Medication Sig Start Date End Date Taking? Authorizing Provider  Ibuprofen-Diphenhydramine Cit (ADVIL PM PO) Take 3 tablets by mouth at bedtime as needed (sleep/pain).   Yes Historical Provider, MD  naproxen sodium (ANAPROX) 220 MG tablet Take 440 mg by mouth 2 (two) times daily as needed (pain).   Yes Historical Provider,  MD  Phenyleph-Doxylamine-DM-APAP (ALKA SELTZER PLUS PO) Take 2 tablets by mouth daily as needed (cold symptoms).   Yes Historical Provider, MD    Physical Exam: Filed Vitals:   05/01/15 1109  BP: 143/91  Pulse: 64  Temp: 98.5 F (36.9 C)  TempSrc: Oral  Resp: 22  SpO2: 98%   General: Not in acute distress HEENT:       Eyes: PERRL, EOMI, no scleral icterus or conjunctival pallor.       ENT: No discharge from the ears or nose, no pharyngeal ulcers, petechiae or exudate, no tonsillar enlargement.        Neck: No JVD, no bruit, no appreciable mass Heme: No cervical adenopathy, no pallor Cardiac: S1/S2, RRR, No murmurs, No gallops or rubs. Pulm: Good air movement bilaterally. No rales, wheezing, rhonchi or rubs. Abd: Soft, nondistended, nontender, no rebound pain or gaurding, no mass or organomegaly, BS present. Ext: No LE edema bilaterally. 2+DP/PT pulse bilaterally. Musculoskeletal: No gross deformity, no red, hot, swollen joints, no limitation in ROM  Skin: No rashes or wounds on exposed surfaces  Neuro: Alert, oriented X3, cranial nerves II-XII grossly intact. No focal findings Psych: Patient is not overtly psychotic, appropriate mood and affect.  Labs on Admission:  Basic Metabolic Panel:  Recent Labs Lab 04/30/15 2025 05/01/15 0942  NA 142 139  K 4.0 4.4  CL 105 107  CO2 24 24  GLUCOSE 90 108*  BUN 17 19  CREATININE 1.25* 1.09  CALCIUM 9.7 8.8*   Liver Function Tests: No results for input(s): AST, ALT, ALKPHOS, BILITOT, PROT, ALBUMIN in the last 168 hours. No results for input(s): LIPASE, AMYLASE in the last 168 hours. No results for input(s): AMMONIA in the last 168 hours. CBC:  Recent Labs Lab 04/30/15 2025 05/01/15 0942  WBC 5.9 5.2  NEUTROABS  --  2.5  HGB 14.3 14.1  HCT 43.2 41.7  MCV 83.6 82.4  PLT 229 234   Cardiac Enzymes:  Recent Labs Lab 05/01/15 0942  TROPONINI <0.03    BNP (last 3 results) No results for input(s): BNP in the last  8760 hours.  ProBNP (last 3 results) No results for input(s): PROBNP in the last 8760 hours.  CBG: No results for input(s): GLUCAP in the last 168 hours.  Radiological Exams on Admission: Dg Chest 2 View  04/30/2015  CLINICAL DATA:  Initial evaluation for 3 week history of chest pain. EXAM: CHEST  2 VIEW COMPARISON:  Prior radiograph from 03/14/2013. FINDINGS: Cardiac and mediastinal silhouettes are stable in size and contour, and remain within normal limits. Mild tortuosity the intrathoracic aorta. Tracheal air column midline and patent. Lungs are normally inflated. No focal infiltrate, pulmonary edema, or pleural effusion. No pneumothorax. No acute osseus abnormality. IMPRESSION: Stable appearance of the chest with no active cardiopulmonary disease identified. Electronically Signed   By: Rise Mu M.D.   On: 04/30/2015 20:27    EKG: Independently reviewed.  Abnormal findings:  Normal sinus rhythm  Assessment/Plan  1. Chest pain  - Most features are atypical for a cardiac etiology, but no alternative diagnosis can be made with confidence at this time  - Initial EKG NSR  without ischemic changes; initial troponin is below the limits of detection  - ASA 324 mg chew and NTG 0.4 mg SL x1 given in ED; metoprolol and Lipitor given on admission  - Monitor on telemetry for ischemic changes - Obtain serial troponin measurements, EKGs  - Risk-stratify with fasting lipid panel and A1c   2. Hypertension  - Above goal currently  - Continue metoprolol IR BID in setting of potential ACS  - Consider nitropaste if CP and BP elevations persist  - If CP has resolved, consider prn hydralazine IVPs     DVT ppx: SQ Heparin     Code Status: Full code Family Communication:  Yes, patient's significant other at bed side Disposition Plan: Admit to inpatient   Date of Service 05/01/2015    Briscoe Deutscher, MD [facility] Pager [phone]  If 7PM-7AM, please contact  night-coverage [email or web address] Password [email or web address] 05/01/2015, 11:44 AM

## 2015-05-01 NOTE — ED Notes (Signed)
Bed: [id number] Expected date:  Expected time:  Means of arrival:  Comments: Needs housekeeping

## 2015-05-01 NOTE — ED Notes (Signed)
Kyle Dominguez said she would call back because she needed to see which nurse was taking the patient

## 2015-05-02 DIAGNOSIS — K029 Dental caries, unspecified: Secondary | ICD-10-CM | POA: Diagnosis not present

## 2015-05-02 DIAGNOSIS — R0789 Other chest pain: Principal | ICD-10-CM

## 2015-05-02 DIAGNOSIS — R0600 Dyspnea, unspecified: Secondary | ICD-10-CM | POA: Diagnosis not present

## 2015-05-02 DIAGNOSIS — G43909 Migraine, unspecified, not intractable, without status migrainosus: Secondary | ICD-10-CM | POA: Diagnosis not present

## 2015-05-02 DIAGNOSIS — I10 Essential (primary) hypertension: Secondary | ICD-10-CM

## 2015-05-02 LAB — HEMOGLOBIN A1C
HEMOGLOBIN A1C: 5.7 % — AB (ref 4.8–5.6)
MEAN PLASMA GLUCOSE: 117 mg/dL

## 2015-05-02 LAB — LIPID PANEL
CHOL/HDL RATIO: 4 ratio
Cholesterol: 179 mg/dL (ref 0–200)
HDL: 45 mg/dL (ref >40)
LDL Cholesterol: 116 mg/dL — ABNORMAL HIGH (ref 0–99)
Triglycerides: 92 mg/dL (ref <150)
VLDL: 18 mg/dL (ref 0–40)

## 2015-05-02 LAB — TROPONIN I

## 2015-05-02 MED ORDER — AMLODIPINE BESYLATE 5 MG PO TABS: 5.0000 mg | ORAL_TABLET | Freq: Every day | ORAL | Status: DC

## 2015-05-02 MED ORDER — IBUPROFEN 200 MG PO TABS: 600.0000 mg | ORAL_TABLET | Freq: Four times a day (QID) | ORAL | Status: DC | PRN

## 2015-05-02 MED ORDER — AMLODIPINE BESYLATE 10 MG PO TABS
10.0000 mg | ORAL_TABLET | Freq: Every day | ORAL | Status: DC
  Administered 2015-05-02 – 2015-05-03 (×2): 10 mg via ORAL
  Filled 2015-05-02 (×2): qty 1

## 2015-05-02 MED ORDER — IBUPROFEN 200 MG PO TABS
600.0000 mg | ORAL_TABLET | Freq: Four times a day (QID) | ORAL | Status: DC | PRN
  Administered 2015-05-02 (×2): 600 mg via ORAL
  Filled 2015-05-02 (×2): qty 3

## 2015-05-02 MED ORDER — HYDRALAZINE HCL 20 MG/ML IJ SOLN: 10.0000 mg | Freq: Four times a day (QID) | INTRAMUSCULAR | Status: DC | PRN

## 2015-05-02 MED ORDER — HYDROCODONE-ACETAMINOPHEN 5-325 MG PO TABS
1.0000 | ORAL_TABLET | Freq: Four times a day (QID) | ORAL | Status: DC | PRN
  Administered 2015-05-02 – 2015-05-03 (×3): 2 via ORAL
  Filled 2015-05-02 (×3): qty 2

## 2015-05-02 MED ORDER — MORPHINE SULFATE (PF) 2 MG/ML IV SOLN
2.0000 mg | INTRAVENOUS | Status: DC | PRN
  Administered 2015-05-02: 2 mg via INTRAVENOUS
  Filled 2015-05-02: qty 1

## 2015-05-02 NOTE — Progress Notes (Signed)
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 No history of recent change in physical activity. Points to left upper anterior chest, pain worse with certain aspects of chest wall movement such as bending to pick up something or rotating chest wall, improved by stretching left upper extremity over head. In the ED, EKG without acute changes, d-dimer negative. Admitted to stepdown for further evaluation. Troponins 3 negative. Cardiology has seen an suspect musculoskeletal etiology for pain and plan outpatient ischemic evaluation. Currently being observed for additional 24 hours for hypertension control. Transferred to telemetry on 3/10. Possible discharge home on 3/11.  Assessment & Plan:   Atypical chest pain/musculoskeletal chest pain. - Historically chest pain quite suspicious for muscular etiology which may have been precipitated by some form of muscle sprain related to his work activity or exercise. - EKG without acute changes. Troponin 4 negative. D-dimer negative. - Symptomatic treatment with NSAIDs. - Cardiology consultation appreciated: They will arrange outpatient follow-up with possible outpatient stress test once blood pressures improved.  Essential hypertension\ - Patient started on metoprolol 25 MG twice a day and then amlodipine added at 10 MG daily. Better controlled. Monitor  History of migraines.    DVT prophylaxis: Subcutaneous heparin Code Status: Full Family Communication: Discussed extensively with patient's spouse  at bedside on 3/10 Disposition Plan: Transfer to telemetry 3/10. Possible discharge home 3/11.   Consultants:  Cardiology  Procedures:  None  Antimicrobials:  None   Subjective: Reported no chest pain when seen this morning. Was not able to reproduce chest pain either. History as above.  Objective: Filed Vitals:   05/02/15 0900 05/02/15 1018 05/02/15 1136 05/02/15 1200  BP: 197/107 177/108 156/87   Pulse: 67 53    Temp:    98.1 F (36.7 C)  TempSrc:    Oral  Resp: 12 12    Height:      Weight:      SpO2: 98% 99%      Intake/Output Summary (Last 24 hours) at 05/02/15 1351 Last data filed at 05/02/15 1100  Gross per 24 hour  Intake    603 ml  Output    100 ml  Net    503 ml   Filed Weights   05/02/15 0100  Weight: 97 kg (213 lb 13.5 oz)    Exam:  General exam: Moderately built and well nourished pleasant young male sitting comfortably in bed this morning. Respiratory system: Clear. No increased work of breathing. No reproducible chest wall tenderness. Cardiovascular system: S1 & S2 heard, RRR. No JVD, murmurs, gallops, clicks or pedal edema. Telemetry: Mostly sinus rhythm. Occasional sinus bradycardia up to 39 bpm-transient and during sleep and asymptomatic (likely physiologic sinus bradycardia). Gastrointestinal system: Abdomen is nondistended, soft and nontender. Normal bowel sounds heard. Central nervous system: Alert and oriented. No focal neurological deficits. Extremities: Symmetric 5 x 5 power.   Data Reviewed: Basic Metabolic Panel:  Recent Labs Lab 04/30/15 2025 05/01/15 0942  NA 142 139  K 4.0 4.4  CL 105 107  CO2 24 24  GLUCOSE 90 108*  BUN 17  19  CREATININE 1.25* 1.09  CALCIUM 9.7 8.8*   Liver Function Tests: No results for input(s): AST, ALT, ALKPHOS, BILITOT, PROT, ALBUMIN in the last 168 hours. No results for input(s): LIPASE, AMYLASE in the last 168 hours. No results for input(s): AMMONIA in the last 168 hours. CBC:  Recent  Labs Lab 04/30/15 2025 05/01/15 0942  WBC 5.9 5.2  NEUTROABS  --  2.5  HGB 14.3 14.1  HCT 43.2 41.7  MCV 83.6 82.4  PLT 229 234   Cardiac Enzymes:  Recent Labs Lab 05/01/15 0942 05/01/15 1205 05/01/15 1751 05/02/15 0330  TROPONINI <0.03 <0.03 <0.03 <0.03   BNP (last 3 results) No results for input(s): PROBNP in the last 8760 hours. CBG: No results for input(s): GLUCAP in the last 168 hours.  Recent Results (from the past 240 hour(s))  MRSA PCR Screening     Status: None   Collection Time: 05/01/15  1:17 PM  Result Value Ref Range Status   MRSA by PCR NEGATIVE NEGATIVE Final    Comment:        The GeneXpert MRSA Assay (FDA approved for NASAL specimens only), is one component of a comprehensive MRSA colonization surveillance program. It is not intended to diagnose MRSA infection nor to guide or monitor treatment for MRSA infections.          Studies: Dg Chest 2 View  04/30/2015  CLINICAL DATA:  Initial evaluation for 3 week history of chest pain. EXAM: CHEST  2 VIEW COMPARISON:  Prior radiograph from 03/14/2013. FINDINGS: Cardiac and mediastinal silhouettes are stable in size and contour, and remain within normal limits. Mild tortuosity the intrathoracic aorta. Tracheal air column midline and patent. Lungs are normally inflated. No focal infiltrate, pulmonary edema, or pleural effusion. No pneumothorax. No acute osseus abnormality. IMPRESSION: Stable appearance of the chest with no active cardiopulmonary disease identified. Electronically Signed   By: Rise MuBenjamin  McClintock M.D.   On: 04/30/2015 20:27        Scheduled Meds: . amLODipine  10 mg Oral Daily  . aspirin EC  81 mg Oral Daily  . atorvastatin  40 mg Oral q1800  . diphenhydrAMINE  25 mg Intravenous Once  . heparin  5,000 Units Subcutaneous 3 times per day  . metoprolol tartrate  25 mg Oral BID  . sodium chloride flush  3 mL Intravenous Q12H   Continuous Infusions:   Principal Problem:   Chest  pain Active Problems:   HTN (hypertension)    Time spent: 25 minutes.    Marcellus ScottHONGALGI,ANAND, MD, FACP, FHM. [facility] Pager [phone]  If 7PM-7AM, please contact night-coverage [email or web address] Password [email or web address] 05/02/2015, 1:51 PM

## 2015-05-02 NOTE — Consult Note (Signed)
CARDIOLOGY CONSULT NOTE   Patient ID: ARRINGTON YOHE MRN: [id number] DOB/AGE: 10-04-1970 45 y.o.  Admit date: 05/01/2015  Primary Physician   Christ Kick, PA Primary Cardiologist: New Reason for Consultation: Chest pain  HPI: Mr. Caudill is a 45 year old male with a PMH of untreated HTN, migraines and marijuana abuse.  He has been seen by [facility] family practice for HTN, but is not on any medications.   He reports that he has had intermittent chest pain for about 2 weeks. The first episode he had after lifting weights, he describes this as pressure and "didn't really bother him".  Then 2 days ago he had 8/10 chest pain that he describes as sharp pain in the left center of his chest that radiated to his left scapula.  He did have some mild SOB and diaphoresis.  He went to [facility] for this but left before being evaluated.    He represented to the ED at [facility] on 05/01/15 as he had some intermittent chest pain the night before. Episodes are sometimes associated with exertion. His troponin has been negative x 3.  EKG is unremarkable for ischemia. He denies any family history of CAD.  He has never had any ischemic work up.   Past Medical History  Diagnosis Date  . Hx of migraines   . Hypertension   . FRACTURE, RIB, LEFT 02/18/2010    Pt with recent trauma to ribs with subsequent fracture. Incident was work related. Is seeing workman's comp appointment MD about incident. Deferring care to [facility] comp physician.       History reviewed. No pertinent past surgical history.  Allergies  Allergen Reactions  . Hydralazine Hcl Nausea And Vomiting and Other (See Comments)    Headache/diaphoresis    I have reviewed the patient's current medications . aspirin EC  81 mg Oral Daily  . atorvastatin  40 mg Oral q1800  . diphenhydrAMINE  25 mg Intravenous Once  . heparin  5,000 Units Subcutaneous 3 times per day  . metoprolol tartrate  25 mg Oral BID  . sodium chloride flush  3 mL Intravenous  Q12H     sodium chloride, acetaminophen, hydrALAZINE, nitroGLYCERIN, ondansetron (ZOFRAN) IV, sodium chloride flush  Prior to Admission medications   Medication Sig Start Date End Date Taking? Authorizing Provider  Ibuprofen-Diphenhydramine Cit (ADVIL PM PO) Take 3 tablets by mouth at bedtime as needed (sleep/pain).   Yes Historical Provider, MD  naproxen sodium (ANAPROX) 220 MG tablet Take 440 mg by mouth 2 (two) times daily as needed (pain).   Yes Historical Provider, MD  Phenyleph-Doxylamine-DM-APAP (ALKA SELTZER PLUS PO) Take 2 tablets by mouth daily as needed (cold symptoms).   Yes Historical Provider, MD     Social History   Social History  . Marital Status: Married    Spouse Name: N/A  . Number of Children: N/A  . Years of Education: N/A   Occupational History  . Not on file.   Social History Main Topics  . Smoking status: Never Smoker   . Smokeless tobacco: Never Used  . Alcohol Use: Yes  . Drug Use: No  . Sexual Activity: Yes   Other Topics Concern  . Not on file   Social History Narrative    No family status information on file.   History reviewed. No pertinent family history.   ROS:  Full 14 point review of systems complete and found to be negative unless listed above.  Physical Exam: Blood  pressure 173/94, pulse 65, temperature 98.2 F (36.8 C), temperature source Oral, resp. rate 14, height 6\' 10"  (2.083 m), weight 213 lb 13.5 oz (97 kg), SpO2 98 %.  General: Well developed, well nourished, male in no acute distress Head: Eyes PERRLA, No xanthomas.   Normocephalic and atraumatic, oropharynx without edema or exudate. Dentition: Good  Lungs: CTA Heart: HRRR S1 S2, no rub/gallop, No murmur. Pulses are 2+ extrem.   Neck: No carotid bruits. No lymphadenopathy.  No JVD. Abdomen: Bowel sounds present, abdomen soft and non-tender without masses or hernias noted. Msk:  No spine or cva tenderness. No weakness, no joint deformities or effusions. Extremities: No  clubbing or cyanosis.  edema.  Neuro: Alert and oriented X 3. No focal deficits noted. Psych:  Good affect, responds appropriately Skin: No rashes or lesions noted.  Labs:   Lab Results  Component Value Date   WBC 5.2 05/01/2015   HGB 14.1 05/01/2015   HCT 41.7 05/01/2015   MCV 82.4 05/01/2015   PLT 234 05/01/2015   No results for input(s): INR in the last 72 hours.  Recent Labs Lab 05/01/15 0942  NA 139  K 4.4  CL 107  CO2 24  BUN 19  CREATININE 1.09  CALCIUM 8.8*  GLUCOSE 108*    Recent Labs  05/01/15 0942 05/01/15 1205 05/01/15 1751 05/02/15 0330  TROPONINI <0.03 <0.03 <0.03 <0.03    Recent Labs  04/30/15 2004  TROPIPOC 0.01   B NATRIURETIC PEPTIDE  Date/Time Value Ref Range Status  05/01/2015 12:05 PM 13.3 0.0 - 100.0 pg/mL Final   Lab Results  Component Value Date   CHOL 179 05/02/2015   HDL 45 05/02/2015   LDLCALC 116* 05/02/2015   TRIG 92 05/02/2015   Lab Results  Component Value Date   DDIMER <0.27 05/01/2015   LIPASE  Date/Time Value Ref Range Status  11/23/2014 12:04 AM 21* 22 - 51 U/L Final   TSH  Date/Time Value Ref Range Status  05/01/2015 12:05 PM 0.706 0.350 - 4.500 uIU/mL Final  11/06/2010 12:36 PM 1.073 0.350 - 4.500 uIU/mL Final    t ECG: NSR  Radiology:  Dg Chest 2 View  04/30/2015  CLINICAL DATA:  Initial evaluation for 3 week history of chest pain. EXAM: CHEST  2 VIEW COMPARISON:  Prior radiograph from 03/14/2013. FINDINGS: Cardiac and mediastinal silhouettes are stable in size and contour, and remain within normal limits. Mild tortuosity the intrathoracic aorta. Tracheal air column midline and patent. Lungs are normally inflated. No focal infiltrate, pulmonary edema, or pleural effusion. No pneumothorax. No acute osseus abnormality. IMPRESSION: Stable appearance of the chest with no active cardiopulmonary disease identified. Electronically Signed   By: Rise MuBenjamin  McClintock M.D.   On: 04/30/2015 20:27    ASSESSMENT AND  PLAN:    Principal Problem:   Chest pain Active Problems:   HTN (hypertension)   Mr. Verlin Grillsrotman is a 45 year old male with a PMH of untreated HTN, migraines and marijuana abuse.  [facility], but is not on any medications. [facility] cardiology was consulted.   1. Chest pain - Troponin negative x 3 - EKG unremarkable - Still having chest pressure.  Consider myoview, can be done outpatient.   2. HTN - Patient has been hypertensive during admission SBP 180-197.  - Suspect that he has had untreated HTN for a long time and could be contributing to CP - On metoprolol now.   -  Will add amlodipine  in setting of HTN in AA male.   - Had reaction to hydralazine last pm.  - Will need to stay admitted until we can control his HTN better.   3. Migraines - Could be associated with coronary vasospasm.   Signed: Little Ishikawa, NP 05/02/2015 8:56 AM   Co-Sign MD  Pain is muscular in nature  No evidence of angina.  He lifts weights and pain improves with change in arm  Position.  Biggest issue is compliance with BP meds and admitting that he needs Rx for it as other doctor's Have tried to start him on meds in past.  Start with metoprolol bid and norvasc 10 mg in between.  Ok to d/c Over weekend if BP better controlled. Will arrange outpatient f/u with me and possible outpatient stress test Once BP better controlled  Exam benign except for elevated BP and S4 gallop  Charlton Haws

## 2015-05-03 DIAGNOSIS — I10 Essential (primary) hypertension: Secondary | ICD-10-CM | POA: Diagnosis not present

## 2015-05-03 DIAGNOSIS — R0789 Other chest pain: Secondary | ICD-10-CM | POA: Diagnosis not present

## 2015-05-03 DIAGNOSIS — K029 Dental caries, unspecified: Secondary | ICD-10-CM | POA: Diagnosis not present

## 2015-05-03 MED ORDER — METOPROLOL TARTRATE 25 MG PO TABS: 25.0000 mg | ORAL_TABLET | Freq: Two times a day (BID) | ORAL | Status: DC

## 2015-05-03 MED ORDER — AMLODIPINE BESYLATE 10 MG PO TABS: 10.0000 mg | ORAL_TABLET | Freq: Every day | ORAL | Status: DC

## 2015-05-03 NOTE — Progress Notes (Signed)
PROGRESS NOTE  Subjective:   Pt admitted with atypical CP and HTN CP is c/w MSK pain - needs a myoview as OP BP is much better.  We discussed the importance of taking his meds He cracked a tooth yesterday and needs to get to a dentist soon   Objective:    Vital Signs:   Temp:  [98 F (36.7 C)-98.4 F (36.9 C)] 98 F (36.7 C) (03/11 0622) Pulse Rate:  [51-74] 69 (03/11 0622) Resp:  [11-23] 20 (03/11 0622) BP: (148-197)/(86-108) 148/87 mmHg (03/11 0622) SpO2:  [96 %-100 %] 100 % (03/11 0622)  Last BM Date: 05/02/15   24-hour weight change: Weight change:   Weight trends: Filed Weights   05/02/15 0100  Weight: 213 lb 13.5 oz (97 kg)    Intake/Output:  03/10 0701 - 03/11 0700 In: 483 [P.O.:480; I.V.:3] Out: -      Physical Exam: BP 148/87 mmHg  Pulse 69  Temp(Src) 98 F (36.7 C) (Oral)  Resp 20  Ht 6\' 10"  (2.083 m)  Wt 213 lb 13.5 oz (97 kg)  BMI 22.36 kg/m2  SpO2 100%  Wt Readings from Last 3 Encounters:  05/02/15 213 lb 13.5 oz (97 kg)  04/30/15 213 lb (96.616 kg)  11/22/14 206 lb (93.441 kg)    General: Vital signs reviewed and noted.   Head: Normocephalic, atraumatic.  Eyes: conjunctivae/corneas clear.  EOM's intact.   Throat: normal  Neck:  normal   Lungs:    clear   Heart:  RR  Abdomen:  Soft, non-tender, non-distended    Extremities: No edema    Neurologic: A&O X3, CN II - XII are grossly intact.   Psych: Normal     Labs: BMET:  Recent Labs  04/30/15 2025 05/01/15 0942  NA 142 139  K 4.0 4.4  CL 105 107  CO2 24 24  GLUCOSE 90 108*  BUN 17 19  CREATININE 1.25* 1.09  CALCIUM 9.7 8.8*    Liver function tests: No results for input(s): AST, ALT, ALKPHOS, BILITOT, PROT, ALBUMIN in the last 72 hours. No results for input(s): LIPASE, AMYLASE in the last 72 hours.  CBC:  Recent Labs  04/30/15 2025 05/01/15 0942  WBC 5.9 5.2  NEUTROABS  --  2.5  HGB 14.3 14.1  HCT 43.2 41.7  MCV 83.6 82.4  PLT 229 234     Cardiac Enzymes:  Recent Labs  05/01/15 0942 05/01/15 1205 05/01/15 1751 05/02/15 0330  TROPONINI <0.03 <0.03 <0.03 <0.03    Coagulation Studies: No results for input(s): LABPROT, INR in the last 72 hours.  Other: Invalid input(s): POCBNP  Recent Labs  05/01/15 0937  DDIMER <0.27    Recent Labs  05/01/15 0942  HGBA1C 5.7*    Recent Labs  05/02/15 0330  CHOL 179  HDL 45  LDLCALC 116*  TRIG 92  CHOLHDL 4.0    Recent Labs  05/01/15 1205  TSH 0.706   No results for input(s): VITAMINB12, FOLATE, FERRITIN, TIBC, IRON, RETICCTPCT in the last 72 hours.   Other results:  Tele   ( personally reviewed )  - NSR   Medications:    Infusions:    Scheduled Medications: . amLODipine  10 mg Oral Daily  . aspirin EC  81 mg Oral Daily  . atorvastatin  40 mg Oral q1800  . heparin  5,000 Units Subcutaneous 3 times per day  . metoprolol tartrate  25 mg Oral BID  . sodium chloride flush  3 mL Intravenous Q12H    Assessment/ Plan:   Principal Problem:   Chest pain Active Problems:   HTN (hypertension)  1. CP , very atypical ,  I t;hink its MSK. He can get a OP myoview    2. HTN  - better controlled, not perfect.    He can be discharged today . Follow up with Dr. Eden Emms  Or with his family practice doctor   Disposition:  Length of Stay:   Alvia Grove., MD, [facility] 05/03/2015, 8:28 AM Office [phone] Pager [phone]

## 2015-05-03 NOTE — Discharge Instructions (Signed)
Hypertension Hypertension, commonly called high blood pressure, is when the force of blood pumping through your arteries is too strong. Your arteries are the blood vessels that carry blood from your heart throughout your body. A blood pressure reading consists of a higher number over a lower number, such as 110/72. The higher number (systolic) is the pressure inside your arteries when your heart pumps. The lower number (diastolic) is the pressure inside your arteries when your heart relaxes. Ideally you want your blood pressure below 120/80. Hypertension forces your heart to work harder to pump blood. Your arteries may become narrow or stiff. Having untreated or uncontrolled hypertension can cause heart attack, stroke, kidney disease, and other problems. RISK FACTORS Some risk factors for high blood pressure are controllable. Others are not.  Risk factors you cannot control include:   Race. You may be at higher risk if you are African American.  Age. Risk increases with age.  Gender. Men are at higher risk than women before age 45 years. After age 65, women are at higher risk than men. Risk factors you can control include:  Not getting enough exercise or physical activity.  Being overweight.  Getting too much fat, sugar, calories, or salt in your diet.  Drinking too much alcohol. SIGNS AND SYMPTOMS Hypertension does not usually cause signs or symptoms. Extremely high blood pressure (hypertensive crisis) may cause headache, anxiety, shortness of breath, and nosebleed. DIAGNOSIS To check if you have hypertension, your health care provider will measure your blood pressure while you are seated, with your arm held at the level of your heart. It should be measured at least twice using the same arm. Certain conditions can cause a difference in blood pressure between your right and left arms. A blood pressure reading that is higher than normal on one occasion does not mean that you need treatment. If  it is not clear whether you have high blood pressure, you may be asked to return on a different day to have your blood pressure checked again. Or, you may be asked to monitor your blood pressure at home for 1 or more weeks. TREATMENT Treating high blood pressure includes making lifestyle changes and possibly taking medicine. Living a healthy lifestyle can help lower high blood pressure. You may need to change some of your habits. Lifestyle changes may include:  Following the DASH diet. This diet is high in fruits, vegetables, and whole grains. It is low in salt, red meat, and added sugars.  Keep your sodium intake below 2,300 mg per day.  Getting at least 30-45 minutes of aerobic exercise at least 4 times per week.  Losing weight if necessary.  Not smoking.  Limiting alcoholic beverages.  Learning ways to reduce stress. Your health care provider may prescribe medicine if lifestyle changes are not enough to get your blood pressure under control, and if one of the following is true:  You are 18-59 years of age and your systolic blood pressure is above 140.  You are 60 years of age or older, and your systolic blood pressure is above 150.  Your diastolic blood pressure is above 90.  You have diabetes, and your systolic blood pressure is over 140 or your diastolic blood pressure is over 90.  You have kidney disease and your blood pressure is above 140/90.  You have heart disease and your blood pressure is above 140/90. Your personal target blood pressure may vary depending on your medical conditions, your age, and other factors. HOME CARE INSTRUCTIONS    Have your blood pressure rechecked as directed by your health care provider.   Take medicines only as directed by your health care provider. Follow the directions carefully. Blood pressure medicines must be taken as prescribed. The medicine does not work as well when you skip doses. Skipping doses also puts you at risk for  problems.  Do not smoke.   Monitor your blood pressure at home as directed by your health care provider. SEEK MEDICAL CARE IF:   You think you are having a reaction to medicines taken.  You have recurrent headaches or feel dizzy.  You have swelling in your ankles.  You have trouble with your vision. SEEK IMMEDIATE MEDICAL CARE IF:  You develop a severe headache or confusion.  You have unusual weakness, numbness, or feel faint.  You have severe chest or abdominal pain.  You vomit repeatedly.  You have trouble breathing. MAKE SURE YOU:   Understand these instructions.  Will watch your condition.  Will get help right away if you are not doing well or get worse.   This information is not intended to replace advice given to you by your health care provider. Make sure you discuss any questions you have with your health care provider.   Document Released: 02/08/2005 Document Revised: 06/25/2014 Document Reviewed: 12/01/2012 Elsevier Interactive Patient Education 2016 Elsevier Inc.  Chest Wall Pain Chest wall pain is pain in or around the bones and muscles of your chest. Sometimes, an injury causes this pain. Sometimes, the cause may not be known. This pain may take several weeks or longer to get better. HOME CARE INSTRUCTIONS  Pay attention to any changes in your symptoms. Take these actions to help with your pain:   Rest as told by your health care provider.   Avoid activities that cause pain. These include any activities that use your chest muscles or your abdominal and side muscles to lift heavy items.   If directed, apply ice to the painful area:  Put ice in a plastic bag.  Place a towel between your skin and the bag.  Leave the ice on for 20 minutes, 2-3 times per day.  Take over-the-counter and prescription medicines only as told by your health care provider.  Do not use tobacco products, including cigarettes, chewing tobacco, and e-cigarettes. If you  need help quitting, ask your health care provider.  Keep all follow-up visits as told by your health care provider. This is important. SEEK MEDICAL CARE IF:  You have a fever.  Your chest pain becomes worse.  You have new symptoms. SEEK IMMEDIATE MEDICAL CARE IF:  You have nausea or vomiting.  You feel sweaty or light-headed.  You have a cough with phlegm (sputum) or you cough up blood.  You develop shortness of breath.   This information is not intended to replace advice given to you by your health care provider. Make sure you discuss any questions you have with your health care provider.   Document Released: 02/08/2005 Document Revised: 10/30/2014 Document Reviewed: 05/06/2014 Elsevier Interactive Patient Education [employer]2016 Elsevier Inc.

## 2015-05-03 NOTE — Discharge Summary (Signed)
Physician Discharge Summary  Kyle Dominguez  ZOX:[id number]  DOB: 22-May-1970  DOA: 05/01/2015  PCP: Kyle KickErvin,Dominguez M, PA  Admit date: 05/01/2015 Discharge date: 05/03/2015  Time spent: Less than 30 minutes  Recommendations for Outpatient Follow-up:  1. Kyle ReedyMichele Lenze, PA-C/Cardiology on 05/19/15 at 11:15 AM. 2. Kyle PaxErvin Alison, PA/PCP in 5 days. Evaluation and adjustment of antihypertensive medications.  Discharge Diagnoses:  Principal Problem:   Chest pain Active Problems:   HTN (hypertension)   Discharge Condition: Improved & Stable  Diet recommendation: Heart healthy diet.  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 No history of recent change in physical activity. Points to left upper anterior chest, pain worse with certain aspects of chest wall movement such as bending to pick up something or rotating chest wall, improved by stretching left upper extremity over head. In the ED, EKG without acute changes, d-dimer negative. Admitted to stepdown for further evaluation. Troponins 3 negative. Cardiology has seen an suspect musculoskeletal etiology for pain and plan outpatient ischemic evaluation.   Hospital Course:   Atypical chest pain/musculoskeletal chest pain. - Historically chest pain quite suspicious for muscular etiology which may have been precipitated by some form of muscle sprain related to his work activity or exercise. - EKG without acute changes. Troponin 4 negative. D-dimer negative. - Symptomatic treatment with NSAIDs. Chest pain has resolved. - Cardiology consultation  appreciated: They have arranged outpatient follow-up for further evaluation. Cardiology has seen today and cleared patient for discharge home.  Essential hypertension - Patient started on metoprolol 25 MG twice a day and then amlodipine added at 10 MG daily. Better controlled but not optimal. Recommended compliance with medications, low sodium diet, close follow-up with PCP where medications may need to be titrated or new ones need to be added. He verbalized understanding. - Some of his elevated blood pressures yesterday afternoon were precipitated by tooth pain but have improved since.  History of migraines.  Tooth ache/dental caries - Patient apparently cracked left lower wisdom tooth sometime yesterday followed by pain. Recommended outpatient follow-up with dentist for tooth extraction.    Consultants:  Cardiology  Procedures:  None  Antimicrobials:  None   Discharge Exam:  Complaints: Anxious to go home. Denies chest pain since night before last. Yesterday experienced left lower wisdom tooth pain after he contracted while biting something. No further tooth pain reported.  Filed Vitals:   05/02/15 1500 05/02/15 1742 05/02/15 2120 05/03/15 0622  BP:  176/98 156/86 148/87  Pulse:  71 74 69  Temp:  98.4 F (36.9 C) 98.2 F (36.8 C) 98 F (36.7 C)  TempSrc:  Oral Oral Oral  Resp: 23 20 20 20   Height:      Weight:      SpO2: 96% 100% 97% 100%    General exam: Moderately built and well nourished pleasant young male sitting comfortably in bed this morning. Respiratory system: Clear. No increased work of breathing. No reproducible chest wall tenderness. Cardiovascular system: S1 & S2 heard, RRR. No JVD, murmurs, gallops, clicks or pedal edema. Telemetry: SB in the 50s-but mostly in sinus rhythm in the  60s. Gastrointestinal system: Abdomen is nondistended, soft and nontender. Normal bowel sounds heard. Central nervous system: Alert and oriented. No focal neurological  deficits. Extremities: Symmetric 5 x 5 power. ENT: Patient has a partially chipped and caried left lower wisdom tooth.  Discharge Instructions      Discharge Instructions    Call MD for:  difficulty breathing, headache or visual disturbances    Complete by:  As directed      Call MD for:  severe uncontrolled pain    Complete by:  As directed      Call MD for:  temperature >100.4    Complete by:  As directed      Diet - low sodium heart healthy    Complete by:  As directed      Increase activity slowly    Complete by:  As directed             Medication List    TAKE these medications        ADVIL PM PO  Take 3 tablets by mouth at bedtime as needed (sleep/pain).     ALKA SELTZER PLUS PO  Take 2 tablets by mouth daily as needed (cold symptoms).     amLODipine 10 MG tablet  Commonly known as:  NORVASC  Take 1 tablet (10 mg total) by mouth daily.     metoprolol tartrate 25 MG tablet  Commonly known as:  LOPRESSOR  Take 1 tablet (25 mg total) by mouth 2 (two) times daily.     naproxen sodium 220 MG tablet  Commonly known as:  ANAPROX  Take 440 mg by mouth 2 (two) times daily as needed (pain).       Follow-up Information    Follow up with Kyle Reedy, PA-C On 05/19/2015.   Specialty:  Cardiology   Why:  11:15 am    Contact information:   [address] [place] [address] [phone]       Follow up with Kyle Kick, PA. Schedule an appointment as soon as possible for a visit in 5 days.   Why:  Evaluation and adjustment of blood pressure medications.   Contact information:   [address] [place] [address] [phone]       Get Medicines reviewed and adjusted: Please take all your medications with you for your next visit with your Primary MD  Please request your Primary MD to go over all hospital tests and procedure/radiological results at the follow up. Please ask your Primary MD to get all Hospital records  sent to his/her office.  If you experience worsening of your admission symptoms, develop shortness of breath, life threatening emergency, suicidal or homicidal thoughts you must seek medical attention immediately by calling 911 or calling your MD immediately if symptoms less severe.  You must read complete instructions/literature along with all the possible adverse reactions/side effects for all the Medicines you take and that have been prescribed to you. Take any new Medicines after you have completely understood and accept all the possible adverse reactions/side effects.   Do not drive when taking pain medications.   Do not take more than prescribed Pain, Sleep and Anxiety Medications  Special Instructions: If you have smoked or chewed Tobacco in the last 2 yrs please stop smoking, stop any regular Alcohol and or any Recreational drug use.  Wear Seat belts while driving.  Please note  You were cared for by a hospitalist during your hospital stay. Once  you are discharged, your primary care physician will handle any further medical issues. Please note that NO REFILLS for any discharge medications will be authorized once you are discharged, as it is imperative that you return to your primary care physician (or establish a relationship with a primary care physician if you do not have one) for your aftercare needs so that they can reassess your need for medications and monitor your lab values.    The results of significant diagnostics from this hospitalization (including imaging, microbiology, ancillary and laboratory) are listed below for reference.    Significant Diagnostic Studies: Dg Chest 2 View  04/30/2015  CLINICAL DATA:  Initial evaluation for 3 week history of chest pain. EXAM: CHEST  2 VIEW COMPARISON:  Prior radiograph from 03/14/2013. FINDINGS: Cardiac and mediastinal silhouettes are stable in size and contour, and remain within normal limits. Mild tortuosity the intrathoracic  aorta. Tracheal air column midline and patent. Lungs are normally inflated. No focal infiltrate, pulmonary edema, or pleural effusion. No pneumothorax. No acute osseus abnormality. IMPRESSION: Stable appearance of the chest with no active cardiopulmonary disease identified. Electronically Signed   By: Rise Mu Dominguez.D.   On: 04/30/2015 20:27    Microbiology: Recent Results (from the past 240 hour(s))  MRSA PCR Screening     Status: None   Collection Time: 05/01/15  1:17 PM  Result Value Ref Range Status   MRSA by PCR NEGATIVE NEGATIVE Final    Comment:        The GeneXpert MRSA Assay (FDA approved for NASAL specimens only), is one component of a comprehensive MRSA colonization surveillance program. It is not intended to diagnose MRSA infection nor to guide or monitor treatment for MRSA infections.      Labs: Basic Metabolic Panel:  Recent Labs Lab 04/30/15 2025 05/01/15 0942  NA 142 139  K 4.0 4.4  CL 105 107  CO2 24 24  GLUCOSE 90 108*  BUN 17 19  CREATININE 1.25* 1.09  CALCIUM 9.7 8.8*   Liver Function Tests: No results for input(s): AST, ALT, ALKPHOS, BILITOT, PROT, ALBUMIN in the last 168 hours. No results for input(s): LIPASE, AMYLASE in the last 168 hours. No results for input(s): AMMONIA in the last 168 hours. CBC:  Recent Labs Lab 04/30/15 2025 05/01/15 0942  WBC 5.9 5.2  NEUTROABS  --  2.5  HGB 14.3 14.1  HCT 43.2 41.7  MCV 83.6 82.4  PLT 229 234   Cardiac Enzymes:  Recent Labs Lab 05/01/15 0942 05/01/15 1205 05/01/15 1751 05/02/15 0330  TROPONINI <0.03 <0.03 <0.03 <0.03   BNP: BNP (last 3 results)  Recent Labs  05/01/15 1205  BNP 13.3    ProBNP (last 3 results) No results for input(s): PROBNP in the last 8760 hours.  CBG: No results for input(s): GLUCAP in the last 168 hours.     Signed:  Marcellus Scott, MD, FACP, FHM. [facility] Pager [phone]  If 7PM-7AM, please contact  night-coverage [email or web address] Password [facility] 05/03/2015, 11:33 AM

## 2015-05-03 NOTE — Progress Notes (Signed)
Reviewed discharge information with patient and caregiver. Answered all questions. Patient/caregiver able to teach back medications and reasons to contact MD/911. Patient verbalizes importance of PCP follow up appointment.  Demitrius Crass M. Andres Bantz, RN  

## 2015-05-19 ENCOUNTER — Ambulatory Visit: Payer: BLUE CROSS/BLUE SHIELD | Admitting: Physician Assistant

## 2015-05-22 ENCOUNTER — Ambulatory Visit: Payer: BLUE CROSS/BLUE SHIELD | Admitting: Cardiology

## 2015-05-22 NOTE — Progress Notes (Signed)
Cardiology Office Note    Date:  05/23/2015   ID:  TONG PIECZYNSKI, DOB Feb 04, 1971, MRN [id number]  PCP:  Christ Kick, PA  Cardiologist:  Dr. Eden Emms    Post hospital follow-up  History of Present Illness:  Kyle Dominguez is a 45 y.o. male untreated HTN, migraines and marijuana abuse who presents for post hospital follow-up.  He was admitted overnight on 73/9/17 for chest pain. Cardiology was consulted and his chest pain was felt to be atypical and more consistent with kin nature. He ruled out for MI. Outpatient Myoview was recommended. Additionally, the patient was hypertensive during admission and he had not been on any hypertensives as an outpatient. He was started on amlodipine 10 mg daily as well as metoprolol 25 mg twice a day.  He presents to clinic for follow-up. He has not had any further chest pain. He thinks the amlodipine may be causing him a headache so PCP had him cut in half (10--> ). No LE edema, orthopnea or PND. No dizziness or syncope. He does exercise 2x/week at the gym and plays basketball with his kids. He denies CP/SOB with exertion.   Past Medical History  Diagnosis Date  . Hx of migraines   . Hypertension   . FRACTURE, RIB, LEFT 02/18/2010    Pt with recent trauma to ribs with subsequent fracture. Incident was work related. Is seeing workman's comp appointment MD about incident. Deferring care to [facility] comp physician.      No past surgical history on file.  Current Medications: Outpatient Prescriptions Prior to Visit  Medication Sig Dispense Refill  . metoprolol tartrate (LOPRESSOR) 25 MG tablet Take 1 tablet (25 mg total) by mouth 2 (two) times daily. 60 tablet 0  . amLODipine (NORVASC) 10 MG tablet Take 1 tablet (10 mg total) by mouth daily. 30 tablet 0  . Ibuprofen-Diphenhydramine Cit (ADVIL PM PO) Take 3 tablets by mouth at bedtime as needed (sleep/pain).    . naproxen sodium (ANAPROX) 220 MG tablet Take 440 mg by mouth 2 (two) times daily  as needed (pain).    . Phenyleph-Doxylamine-DM-APAP (ALKA SELTZER PLUS PO) Take 2 tablets by mouth daily as needed (cold symptoms).     No facility-administered medications prior to visit.     Allergies:   Hydralazine hcl   Social History   Social History  . Marital Status: Married    Spouse Name: N/A  . Number of Children: N/A  . Years of Education: N/A   Social History Main Topics  . Smoking status: Never Smoker   . Smokeless tobacco: Never Used  . Alcohol Use: Yes  . Drug Use: No  . Sexual Activity: Yes   Other Topics Concern  . None   Social History Narrative     Family History:  The patient's mother has HTN. He didn't know his father. His nephew has HTN.    ROS:   Please see the history of present illness.    ROS All other systems reviewed and are negative.   PHYSICAL EXAM:   VS:  BP 120/82 mmHg  Pulse 72  Ht 6' (1.829 m)  Wt 210 lb (95.255 kg)  BMI 28.47 kg/m2   GEN: Well nourished, well developed, in no acute distress HEENT: normal Neck: no JVD, carotid bruits, or masses Cardiac: RRR; no murmurs, rubs, or gallops,no edema  Respiratory:  clear to auscultation bilaterally, normal work of breathing GI: soft, nontender, nondistended, + BS MS: no deformity or atrophy Skin: warm  and dry, no rash Neuro:  Alert and Oriented x 3, Strength and sensation are intact Psych: euthymic mood, full affect  Wt Readings from Last 3 Encounters:  05/23/15 210 lb (95.255 kg)  05/02/15 213 lb 13.5 oz (97 kg)  04/30/15 213 lb (96.616 kg)      Studies/Labs Reviewed:   EKG:  EKG is not ordered today.    Recent Labs: 11/23/2014: ALT 19 05/01/2015: B Natriuretic Peptide 13.3; BUN 19; Creatinine, Ser 1.09; Hemoglobin 14.1; Platelets 234; Potassium 4.4; Sodium 139; TSH 0.706   Lipid Panel    Component Value Date/Time   CHOL 179 05/02/2015 0330   TRIG 92 05/02/2015 0330   HDL 45 05/02/2015 0330   CHOLHDL 4.0 05/02/2015 0330   VLDL 18 05/02/2015 0330   LDLCALC 116*  05/02/2015 0330    Additional studies/ records that were reviewed today include:  none    ASSESSMENT:    1. Chest pain, unspecified chest pain type   2. Essential hypertension      PLAN:  In order of problems listed above:  Chest pain: felt to be more c/w MSK pain. He has not had anymore CP since discharge.  Will set up ETT myoview. If low risk he can follow with PCP and does not need long term cardiology follow up.   HTN: excellent control today. BP 120/82 on amlodipine 5mg  daily and Lopressor 25mg  BID. (he cut amlodipine 10mg  in half which has helped his HAs).     Medication Adjustments/Labs and Tests Ordered: Current medicines are reviewed at length with the patient today.  Concerns regarding medicines are outlined above.  Medication changes, Labs and Tests ordered today are listed in the Patient Instructions below. Patient Instructions  Medication Instructions:  Your physician has recommended you make the following change in your medication:  1.  DECREASE the Amlodipine 10 mg to taking 1/2 tablet daily   Labwork: None ordered  Testing/Procedures: Your physician has requested that you have en exercise stress myoview. For further information please visit [email or web address]. Please follow instruction sheet, as given.   Follow-Up: Your physician recommends that you schedule a follow-up appointment in: WILL BE BASED UPON YOUR TEST RESULTS   Any Other Special Instructions Will Be Listed Below (If Applicable).     If you need a refill on your cardiac medications before your next appointment, please call your pharmacy.       Charlestine MassedSigned, Dyrell Tuccillo R, PA-C  05/23/2015 3:40 PM    [facility] [address], [place], [place]  [address] Phone: [phone]; Fax: [phone]

## 2015-05-23 ENCOUNTER — Encounter: Payer: Self-pay | Admitting: *Deleted

## 2015-05-23 ENCOUNTER — Encounter: Payer: Self-pay | Admitting: Physician Assistant

## 2015-05-23 ENCOUNTER — Ambulatory Visit (INDEPENDENT_AMBULATORY_CARE_PROVIDER_SITE_OTHER): Payer: BLUE CROSS/BLUE SHIELD | Admitting: Physician Assistant

## 2015-05-23 VITALS — BP 120/82 | HR 72 | Ht 72.0 in | Wt 210.0 lb

## 2015-05-23 DIAGNOSIS — I10 Essential (primary) hypertension: Secondary | ICD-10-CM | POA: Diagnosis not present

## 2015-05-23 DIAGNOSIS — R079 Chest pain, unspecified: Secondary | ICD-10-CM | POA: Diagnosis not present

## 2015-05-23 MED ORDER — AMLODIPINE BESYLATE 10 MG PO TABS: 5.0000 mg | ORAL_TABLET | Freq: Every day | ORAL | Status: DC

## 2015-05-23 NOTE — Patient Instructions (Signed)
Medication Instructions:  Your physician has recommended you make the following change in your medication:  1.  DECREASE the Amlodipine 10 mg to taking 1/2 tablet daily   Labwork: None ordered  Testing/Procedures: Your physician has requested that you have en exercise stress myoview. For further information please visit [email or web address]. Please follow instruction sheet, as given.   Follow-Up: Your physician recommends that you schedule a follow-up appointment in: WILL BE BASED UPON YOUR TEST RESULTS   Any Other Special Instructions Will Be Listed Below (If Applicable).     If you need a refill on your cardiac medications before your next appointment, please call your pharmacy.

## 2015-06-04 ENCOUNTER — Telehealth (HOSPITAL_COMMUNITY): Payer: Self-pay | Admitting: *Deleted

## 2015-06-04 NOTE — Telephone Encounter (Signed)
Attempted to call patient regarding upcoming appointment- no answer. Kyle Dominguez [name], RN 

## 2015-06-06 ENCOUNTER — Telehealth: Payer: Self-pay | Admitting: Physician Assistant

## 2015-06-06 ENCOUNTER — Ambulatory Visit (HOSPITAL_COMMUNITY): Payer: BLUE CROSS/BLUE SHIELD | Attending: Cardiovascular Disease

## 2015-06-06 ENCOUNTER — Encounter (HOSPITAL_COMMUNITY): Payer: BLUE CROSS/BLUE SHIELD

## 2015-06-06 DIAGNOSIS — I10 Essential (primary) hypertension: Secondary | ICD-10-CM

## 2015-06-06 DIAGNOSIS — R079 Chest pain, unspecified: Secondary | ICD-10-CM | POA: Diagnosis not present

## 2015-06-06 LAB — MYOCARDIAL PERFUSION IMAGING
CHL CUP NUCLEAR SRS: 1
CHL CUP NUCLEAR SSS: 1
CSEPPHR: 136 {beats}/min
LV dias vol: 140 mL (ref 62–150)
LVSYSVOL: 67 mL
RATE: 0.22
Rest HR: 62 {beats}/min
SDS: 0
TID: 1

## 2015-06-06 MED ORDER — TECHNETIUM TC 99M SESTAMIBI GENERIC - CARDIOLITE
32.2000 | Freq: Once | INTRAVENOUS | Status: AC | PRN
  Administered 2015-06-06: 32.2 via INTRAVENOUS

## 2015-06-06 MED ORDER — REGADENOSON 0.4 MG/5ML IV SOLN
0.4000 mg | Freq: Once | INTRAVENOUS | Status: AC
  Administered 2015-06-06: 0.4 mg via INTRAVENOUS

## 2015-06-06 MED ORDER — TECHNETIUM TC 99M SESTAMIBI GENERIC - CARDIOLITE
10.5000 | Freq: Once | INTRAVENOUS | Status: AC | PRN
  Administered 2015-06-06: 11 via INTRAVENOUS

## 2015-06-06 NOTE — Telephone Encounter (Signed)
New Message:  Pt is calling back to get the results to his Stress test.

## 2015-06-06 NOTE — Telephone Encounter (Signed)
Returned pt's call. Discussed pts stress test results. Pt verbalized udnerstanding

## 2015-06-20 ENCOUNTER — Other Ambulatory Visit: Payer: Self-pay | Admitting: Physician Assistant

## 2016-05-04 ENCOUNTER — Other Ambulatory Visit: Payer: Self-pay | Admitting: Physician Assistant

## 2016-12-07 ENCOUNTER — Emergency Department (HOSPITAL_COMMUNITY)
Admission: EM | Admit: 2016-12-07 | Discharge: 2016-12-07 | Disposition: A | Discharge status: Home / Self Care | Payer: BLUE CROSS/BLUE SHIELD | Attending: Emergency Medicine | Admitting: Emergency Medicine

## 2016-12-07 ENCOUNTER — Emergency Department (HOSPITAL_COMMUNITY): Payer: BLUE CROSS/BLUE SHIELD

## 2016-12-07 ENCOUNTER — Encounter (HOSPITAL_COMMUNITY): Payer: Self-pay | Admitting: Emergency Medicine

## 2016-12-07 DIAGNOSIS — I10 Essential (primary) hypertension: Secondary | ICD-10-CM | POA: Insufficient documentation

## 2016-12-07 DIAGNOSIS — R1032 Left lower quadrant pain: Secondary | ICD-10-CM | POA: Diagnosis present

## 2016-12-07 DIAGNOSIS — Z79899 Other long term (current) drug therapy: Secondary | ICD-10-CM | POA: Insufficient documentation

## 2016-12-07 DIAGNOSIS — K5792 Diverticulitis of intestine, part unspecified, without perforation or abscess without bleeding: Secondary | ICD-10-CM | POA: Insufficient documentation

## 2016-12-07 LAB — URINALYSIS, ROUTINE W REFLEX MICROSCOPIC
Bilirubin Urine: NEGATIVE
Glucose, UA: NEGATIVE mg/dL
Hgb urine dipstick: NEGATIVE
KETONES UR: NEGATIVE mg/dL
LEUKOCYTES UA: NEGATIVE
NITRITE: NEGATIVE
PROTEIN: NEGATIVE mg/dL
Specific Gravity, Urine: 1.019 (ref 1.005–1.030)
pH: 5 (ref 5.0–8.0)

## 2016-12-07 LAB — CBC
HCT: 41.5 % (ref 39.0–52.0)
Hemoglobin: 13.3 g/dL (ref 13.0–17.0)
MCH: 27.3 pg (ref 26.0–34.0)
MCHC: 32 g/dL (ref 30.0–36.0)
MCV: 85 fL (ref 78.0–100.0)
PLATELETS: 219 10*3/uL (ref 150–400)
RBC: 4.88 MIL/uL (ref 4.22–5.81)
RDW: 13.7 % (ref 11.5–15.5)
WBC: 12.2 10*3/uL — AB (ref 4.0–10.5)

## 2016-12-07 LAB — COMPREHENSIVE METABOLIC PANEL
ALT: 22 U/L (ref 17–63)
ANION GAP: 10 (ref 5–15)
AST: 25 U/L (ref 15–41)
Albumin: 4 g/dL (ref 3.5–5.0)
Alkaline Phosphatase: 82 U/L (ref 38–126)
BUN: 10 mg/dL (ref 6–20)
CALCIUM: 9.2 mg/dL (ref 8.9–10.3)
CHLORIDE: 103 mmol/L (ref 101–111)
CO2: 25 mmol/L (ref 22–32)
CREATININE: 1.13 mg/dL (ref 0.61–1.24)
GFR calc non Af Amer: >60 mL/min (ref >60)
Glucose, Bld: 111 mg/dL — ABNORMAL HIGH (ref 65–99)
Potassium: 3.5 mmol/L (ref 3.5–5.1)
SODIUM: 138 mmol/L (ref 135–145)
TOTAL PROTEIN: 7.4 g/dL (ref 6.5–8.1)
Total Bilirubin: 1.1 mg/dL (ref 0.3–1.2)

## 2016-12-07 LAB — LIPASE, BLOOD: LIPASE: 41 U/L (ref 11–51)

## 2016-12-07 MED ORDER — CIPROFLOXACIN HCL 500 MG PO TABS: 500.0000 mg | ORAL_TABLET | Freq: Two times a day (BID) | ORAL | 0 refills | Status: DC

## 2016-12-07 MED ORDER — CIPROFLOXACIN IN D5W 400 MG/200ML IV SOLN
400.0000 mg | Freq: Once | INTRAVENOUS | Status: AC
  Administered 2016-12-07: 400 mg via INTRAVENOUS
  Filled 2016-12-07: qty 200

## 2016-12-07 MED ORDER — MORPHINE SULFATE (PF) 4 MG/ML IV SOLN
4.0000 mg | Freq: Once | INTRAVENOUS | Status: AC
  Administered 2016-12-07: 4 mg via INTRAVENOUS
  Filled 2016-12-07: qty 1

## 2016-12-07 MED ORDER — HYDROCODONE-ACETAMINOPHEN 5-325 MG PO TABS: 1.0000 | ORAL_TABLET | Freq: Four times a day (QID) | ORAL | 0 refills | Status: DC | PRN

## 2016-12-07 MED ORDER — IOPAMIDOL (ISOVUE-300) INJECTION 61%
100.0000 mL | Freq: Once | INTRAVENOUS | Status: AC | PRN
  Administered 2016-12-07: 100 mL via INTRAVENOUS

## 2016-12-07 MED ORDER — METRONIDAZOLE 500 MG PO TABS: 500.0000 mg | ORAL_TABLET | Freq: Two times a day (BID) | ORAL | 0 refills | Status: DC

## 2016-12-07 MED ORDER — PROMETHAZINE HCL 25 MG PO TABS: 25.0000 mg | ORAL_TABLET | Freq: Four times a day (QID) | ORAL | 0 refills | Status: DC | PRN

## 2016-12-07 MED ORDER — METRONIDAZOLE IN NACL 5-0.79 MG/ML-% IV SOLN
500.0000 mg | Freq: Once | INTRAVENOUS | Status: AC
  Administered 2016-12-07: 500 mg via INTRAVENOUS
  Filled 2016-12-07: qty 100

## 2016-12-07 MED ORDER — SODIUM CHLORIDE 0.9 % IV BOLUS (SEPSIS)
1000.0000 mL | Freq: Once | INTRAVENOUS | Status: AC
  Administered 2016-12-07: 1000 mL via INTRAVENOUS

## 2016-12-07 MED ORDER — ONDANSETRON HCL 4 MG/2ML IJ SOLN
4.0000 mg | Freq: Once | INTRAMUSCULAR | Status: AC
  Administered 2016-12-07: 4 mg via INTRAVENOUS
  Filled 2016-12-07: qty 2

## 2016-12-07 NOTE — ED Notes (Signed)
While getting pt's blood I noticed pt is a little pale and diaphoretic. Asked pt if it was due to blood draw he said no its due to pain and not being able to move. Reita Cliche RN notified

## 2016-12-07 NOTE — ED Triage Notes (Signed)
Patient reports low abdominal pain onset last night with emesis x1 , denies diarrhea , no fever or chills .

## 2016-12-07 NOTE — ED Provider Notes (Signed)
[facility] EMERGENCY DEPARTMENT Provider Note   CSN: [id number] Arrival date & time: 12/07/16  0545     History   Chief Complaint Chief Complaint  Patient presents with  . Abdominal Pain    HPI Kyle Dominguez is a 46 y.o. male.  HPI   46 year old male presenting for evaluation of low abdominal pain. Patient developed gradual onset of persistent worsening sharp pressure pain to his lower abdomen that started yesterday and has become progressively worse. Currently rated pain as 10 out of 10, radiates to his back. Pain is worse with palpation or with movement.  Today he felt nauseous and vomited once. Last bowel movement was yesterday. He denies any specific treatment tried. He denies having fever, chills, headache, lightheadedness, dizziness, dysuria, hematuria, penile discharge, no signs of hernia. He is sexually active with one partner, he is married.no prior abdominal surgery. Still has intact appendix.   Past Medical History:  Diagnosis Date  . FRACTURE, RIB, LEFT 02/18/2010   Pt with recent trauma to ribs with subsequent fracture. Incident was work related. Is seeing workman's comp appointment MD about incident. Deferring care to [facility] comp physician.    Marland Kitchen Hx of migraines   . Hypertension     Patient Active Problem List   Diagnosis Date Noted  . Abdominal pain, RLQ 03/04/2014  . Lymphadenopathy, submandibular 05/23/2013  . Chest pain 02/01/2013  . 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 Anxiety and depression 10/23/2010  . HTN (hypertension) 06/17/2010  . Allergic rhinitis 04/30/2010  . Screening for lipoid disorders 04/07/2010  . LOW BACK PAIN, CHRONIC 01/28/2010  . MUSCLE CRAMPS 01/28/2010  . MIGRAINE HEADACHE 04/25/2009    History reviewed. No pertinent surgical history.     Home Medications    Prior to Admission medications   Medication Sig Start Date End Date Taking? Authorizing Provider  amLODipine (NORVASC) 10 MG tablet Take  0.5 tablets (5 mg total) by mouth daily. Please refer future refills to Primary Care doctor. 05/05/16   Janetta Hora, PA-C  metoprolol tartrate (LOPRESSOR) 25 MG tablet Take 1 tablet (25 mg total) by mouth 2 (two) times daily. 05/03/15   Hongalgi, Maximino Greenland, MD    Family History No family history on file.  Social History Social History  Substance Use Topics  . Smoking status: Never Smoker  . Smokeless tobacco: Never Used  . Alcohol use Yes     Allergies   Hydralazine hcl   Review of Systems Review of Systems  All other systems reviewed and are negative.    Physical Exam Updated Vital Signs BP (!) 157/94 (BP Location: Left Arm)   Pulse 87   Temp 98.9 F (37.2 C) (Oral)   Resp 20   Ht 6' (1.829 m)   Wt 93 kg (205 lb)   SpO2 99%   BMI 27.80 kg/m   Physical Exam  Constitutional: He appears well-developed and well-nourished. No distress.  Patient appears mildly uncomfortable but nontoxic  HENT:  Head: Atraumatic.  Eyes: Conjunctivae are normal.  Neck: Neck supple.  Cardiovascular: Normal rate and regular rhythm.   Pulmonary/Chest: Effort normal and breath sounds normal.  Abdominal: Soft. Bowel sounds are normal. He exhibits no distension. There is tenderness (tenderness to suprapubic and left lower quadrant on palpation with guarding but without rebound tenderness. Negative Murphy sign, no pain at McBurney's point).  Genitourinary:  Genitourinary Comments: Chaperone present during exam. No inguinal lymphadenopathy or inguinal hernia noted. Normal circumcised penis with normal shaft. No  tenderness to testicles scrotum.  Neurological: He is alert.  Skin: No rash noted.  Psychiatric: He has a normal mood and affect.  Nursing note and vitals reviewed.    ED Treatments / Results  Labs (all labs ordered are listed, but only abnormal results are displayed) Labs Reviewed  COMPREHENSIVE METABOLIC PANEL - Abnormal; Notable for the following:       Result Value    Glucose, Bld 111 (*)    All other components within normal limits  CBC - Abnormal; Notable for the following:    WBC 12.2 (*)    All other components within normal limits  LIPASE, BLOOD  URINALYSIS, ROUTINE W REFLEX MICROSCOPIC  POC OCCULT BLOOD, ED    EKG  EKG Interpretation None       Radiology Ct Abdomen Pelvis W Contrast  Result Date: 12/07/2016 CLINICAL DATA:  Abdominal pain started last night. No diarrhea. No fever chills. EXAM: CT ABDOMEN AND PELVIS WITH CONTRAST TECHNIQUE: Multidetector CT imaging of the abdomen and pelvis was performed using the standard protocol following bolus administration of intravenous contrast. CONTRAST:  ISOVUE-300 IOPAMIDOL (ISOVUE-300) INJECTION 61% COMPARISON:  11/23/2014 FINDINGS: Lower chest: No acute abnormality. Hepatobiliary: No focal liver abnormality is seen. No gallstones, gallbladder wall thickening, or biliary dilatation. Pancreas: Unremarkable. No pancreatic ductal dilatation or surrounding inflammatory changes. Spleen: Normal in size without focal abnormality. Adrenals/Urinary Tract: Adrenal glands are unremarkable. Small 10 mm hypodense, fluid attenuating right renal mass most consistent with a small cyst. Kidneys are otherwise normal, without renal calculi, solid lesion, or hydronephrosis. Bladder is unremarkable. Stomach/Bowel: Stomach is within normal limits. Appendix appears normal. No bowel dilatation to suggest obstruction. Diverticulosis of the descending colon with bowel wall thickening and severe surrounding inflammatory changes involving the distal descending colon most consistent with diverticulitis. No peridiverticular fluid collection to suggest an abscess. No pneumatosis, pneumoperitoneum or portal venous gas. Vascular/Lymphatic: No significant vascular findings are present. No enlarged abdominal or pelvic lymph nodes. Reproductive: Prostate is unremarkable. Other: No abdominal wall hernia or abnormality. No abdominopelvic  ascites. Musculoskeletal: No acute osseous abnormality. No aggressive lytic or sclerotic osseous lesion. IMPRESSION: 1. Acute distal descending colon diverticulitis. No peridiverticular fluid collection to suggest an abscess. Electronically Signed   By: Kyle Dominguez   On: 12/07/2016 09:58    Procedures Procedures (including critical care time)  Medications Ordered in ED Medications  sodium chloride 0.9 % bolus 1,000 mL (0 mLs Intravenous Stopped 12/07/16 1154)  morphine 4 MG/ML injection 4 mg (4 mg Intravenous Given 12/07/16 0853)  ondansetron (ZOFRAN) injection 4 mg (4 mg Intravenous Given 12/07/16 0853)  iopamidol (ISOVUE-300) 61 % injection 100 mL (100 mLs Intravenous Contrast Given 12/07/16 0935)  ciprofloxacin (CIPRO) IVPB 400 mg (0 mg Intravenous Stopped 12/07/16 1330)  metroNIDAZOLE (FLAGYL) IVPB 500 mg (0 mg Intravenous Stopped 12/07/16 1154)  morphine 4 MG/ML injection 4 mg (4 mg Intravenous Given 12/07/16 1233)     Initial Impression / Assessment and Plan / ED Course  I have reviewed the triage vital signs and the nursing notes.  Pertinent labs & imaging results that were available during my care of the patient were reviewed by me and considered in my medical decision making (see chart for details).     BP (!) 142/84   Pulse 80   Temp 98.9 F (37.2 C) (Oral)   Resp 16   Ht 6' (1.829 m)   Wt 93 kg (205 lb)   SpO2 98%   BMI 27.80 kg/m  Final Clinical Impressions(s) / ED Diagnoses   Final diagnoses:  Diverticulitis    New Prescriptions New Prescriptions   CIPROFLOXACIN (CIPRO) 500 MG TABLET    Take 1 tablet (500 mg total) by mouth 2 (two) times daily. One po bid x 7 days   HYDROCODONE-ACETAMINOPHEN (NORCO/VICODIN) 5-325 MG TABLET    Take 1 tablet by mouth every 6 (six) hours as needed for moderate pain.   METRONIDAZOLE (FLAGYL) 500 MG TABLET    Take 1 tablet (500 mg total) by mouth 2 (two) times daily.   PROMETHAZINE (PHENERGAN) 25 MG TABLET    Take 1 tablet  (25 mg total) by mouth every 6 (six) hours as needed for nausea.   8:47 AM Patient with reproducible tenderness to left lower quadrant suggestive of potential diverticulitis. Will obtain abdominal pelvic CT scan for evaluation. IV fluid, pain medication and antinausea medication given.  2:09 PM Mildly elevated WBC of 12.2. Labs are otherwise reassuring. An abdominal and pelvis CT scan obtained demonstrated evidence ofacute distal descending colon diverticulitis without any fluid collection to suggest an abscess any signs of perforation. Patient received Cipro and Flagyl via IV in the ER. He will be discharged home with Cipro and Flagyl along with pain medication and antinausea medication. In order to decrease risk of narcotic abuse. Pt's record were checked using the [place] Controlled Substance database.     Fayrene Helper, PA-C 12/07/16 1412    Long, Arlyss Repress, MD 12/07/16 [phone]

## 2016-12-07 NOTE — ED Notes (Signed)
Pt refusing occult blood sample. Pt denies any bleeding from rectum

## 2018-01-11 ENCOUNTER — Emergency Department (HOSPITAL_COMMUNITY): Payer: BLUE CROSS/BLUE SHIELD

## 2018-01-11 ENCOUNTER — Other Ambulatory Visit: Payer: Self-pay

## 2018-01-11 ENCOUNTER — Emergency Department (HOSPITAL_COMMUNITY)
Admission: EM | Admit: 2018-01-11 | Discharge: 2018-01-11 | Disposition: A | Discharge status: Home / Self Care | Payer: BLUE CROSS/BLUE SHIELD | Attending: Emergency Medicine | Admitting: Emergency Medicine

## 2018-01-11 ENCOUNTER — Encounter (HOSPITAL_COMMUNITY): Payer: Self-pay | Admitting: *Deleted

## 2018-01-11 DIAGNOSIS — F419 Anxiety disorder, unspecified: Secondary | ICD-10-CM | POA: Insufficient documentation

## 2018-01-11 DIAGNOSIS — N201 Calculus of ureter: Secondary | ICD-10-CM | POA: Insufficient documentation

## 2018-01-11 DIAGNOSIS — I10 Essential (primary) hypertension: Secondary | ICD-10-CM | POA: Insufficient documentation

## 2018-01-11 DIAGNOSIS — Z79899 Other long term (current) drug therapy: Secondary | ICD-10-CM | POA: Diagnosis not present

## 2018-01-11 DIAGNOSIS — F329 Major depressive disorder, single episode, unspecified: Secondary | ICD-10-CM | POA: Insufficient documentation

## 2018-01-11 DIAGNOSIS — R1031 Right lower quadrant pain: Secondary | ICD-10-CM | POA: Diagnosis present

## 2018-01-11 LAB — CBC
HEMATOCRIT: 41.6 % (ref 39.0–52.0)
HEMOGLOBIN: 13.3 g/dL (ref 13.0–17.0)
MCH: 27.3 pg (ref 26.0–34.0)
MCHC: 32 g/dL (ref 30.0–36.0)
MCV: 85.4 fL (ref 80.0–100.0)
NRBC: 0 % (ref 0.0–0.2)
Platelets: 216 10*3/uL (ref 150–400)
RBC: 4.87 MIL/uL (ref 4.22–5.81)
RDW: 12.6 % (ref 11.5–15.5)
WBC: 8.7 10*3/uL (ref 4.0–10.5)

## 2018-01-11 LAB — URINALYSIS, ROUTINE W REFLEX MICROSCOPIC
Bacteria, UA: NONE SEEN
Bilirubin Urine: NEGATIVE
GLUCOSE, UA: NEGATIVE mg/dL
Ketones, ur: NEGATIVE mg/dL
LEUKOCYTES UA: NEGATIVE
NITRITE: NEGATIVE
PH: 5 (ref 5.0–8.0)
Protein, ur: NEGATIVE mg/dL
SPECIFIC GRAVITY, URINE: 1.01 (ref 1.005–1.030)

## 2018-01-11 LAB — BASIC METABOLIC PANEL
ANION GAP: 8 (ref 5–15)
BUN: 13 mg/dL (ref 6–20)
CALCIUM: 9.5 mg/dL (ref 8.9–10.3)
CHLORIDE: 104 mmol/L (ref 98–111)
CO2: 27 mmol/L (ref 22–32)
Creatinine, Ser: 1.34 mg/dL — ABNORMAL HIGH (ref 0.61–1.24)
GFR calc non Af Amer: >60 mL/min (ref >60)
Glucose, Bld: 109 mg/dL — ABNORMAL HIGH (ref 70–99)
Potassium: 4 mmol/L (ref 3.5–5.1)
Sodium: 139 mmol/L (ref 135–145)

## 2018-01-11 MED ORDER — KETOROLAC TROMETHAMINE 30 MG/ML IJ SOLN
30.0000 mg | Freq: Once | INTRAMUSCULAR | Status: AC
  Administered 2018-01-11: 30 mg via INTRAVENOUS
  Filled 2018-01-11: qty 1

## 2018-01-11 MED ORDER — TAMSULOSIN HCL 0.4 MG PO CAPS: 0.4000 mg | ORAL_CAPSULE | Freq: Every day | ORAL | 0 refills | Status: AC

## 2018-01-11 MED ORDER — MORPHINE SULFATE (PF) 4 MG/ML IV SOLN
4.0000 mg | Freq: Once | INTRAVENOUS | Status: AC
  Administered 2018-01-11: 4 mg via INTRAVENOUS
  Filled 2018-01-11: qty 1

## 2018-01-11 MED ORDER — OXYCODONE-ACETAMINOPHEN 5-325 MG PO TABS: 1.0000 | ORAL_TABLET | ORAL | 0 refills | Status: DC | PRN

## 2018-01-11 MED ORDER — ONDANSETRON 8 MG PO TBDP: 8.0000 mg | ORAL_TABLET | Freq: Three times a day (TID) | ORAL | 0 refills | Status: DC | PRN

## 2018-01-11 NOTE — ED Notes (Signed)
Patient transported to CT 

## 2018-01-11 NOTE — ED Notes (Signed)
ED Provider at bedside. 

## 2018-01-11 NOTE — ED Triage Notes (Signed)
Pt reports right flank pain, was radiating to the anterior abdomen, but now only having flank pain. Denies n/v/d, fevers or urinary symptoms.

## 2018-01-11 NOTE — ED Triage Notes (Signed)
Pt arrives via EMS from home with c/o sudden right flank pain this evening. En route vitals 138/86, 74, 18

## 2018-01-11 NOTE — Discharge Instructions (Addendum)
Call the Urology office for follow up

## 2018-01-12 NOTE — ED Provider Notes (Signed)
[facility] EMERGENCY DEPARTMENT Provider Note   CSN: [id number] Arrival date & time: 01/11/18  1951     History   Chief Complaint Chief Complaint  Patient presents with  . Flank Pain    HPI Kyle Dominguez is a 47 y.o. male.  HPI Patient is a 47 year old male presents the emergency department with acute onset right flank pain with radiation down towards his right groin.  No prior history of kidney stones.  His pain is waxing and waning.  It is severe in severity.  No nausea or vomiting.  No dysuria or urinary frequency.  Patient is never had pain like this before.  He was in his normal state of health prior to the development of this pain this evening.   Past Medical History:  Diagnosis Date  . FRACTURE, RIB, LEFT 02/18/2010   Pt with recent trauma to ribs with subsequent fracture. Incident was work related. Is seeing workman's comp appointment MD about incident. Deferring care to [facility] comp physician.    Marland Kitchen Hx of migraines   . Hypertension     Patient Active Problem List   Diagnosis Date Noted  . Abdominal pain, RLQ 03/04/2014  . Lymphadenopathy, submandibular 05/23/2013  . Chest pain 02/01/2013  . 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 Anxiety and depression 10/23/2010  . HTN (hypertension) 06/17/2010  . Allergic rhinitis 04/30/2010  . Screening for lipoid disorders 04/07/2010  . LOW BACK PAIN, CHRONIC 01/28/2010  . MUSCLE CRAMPS 01/28/2010  . MIGRAINE HEADACHE 04/25/2009    History reviewed. No pertinent surgical history.      Home Medications    Prior to Admission medications   Medication Sig Start Date End Date Taking? Authorizing Provider  acetaminophen (TYLENOL) 500 MG tablet Take 500-1,000 mg by mouth every 6 (six) hours as needed for mild pain or headache.    [provider]  amLODipine (NORVASC) 10 MG tablet Take 0.5 tablets (5 mg total) by mouth daily. Please refer future refills to Primary Care doctor. 05/05/16    Janetta Hora, PA-C  ciprofloxacin (CIPRO) 500 MG tablet Take 1 tablet (500 mg total) by mouth 2 (two) times daily. One po bid x 7 days 12/07/16   Fayrene Helper, PA-C  HYDROcodone-acetaminophen (NORCO/VICODIN) 5-325 MG tablet Take 1 tablet by mouth every 6 (six) hours as needed for moderate pain. 12/07/16   Fayrene Helper, PA-C  ibuprofen (ADVIL,MOTRIN) 200 MG tablet Take 400 mg by mouth every 6 (six) hours as needed for headache or mild pain.    [provider]  metoprolol tartrate (LOPRESSOR) 25 MG tablet Take 1 tablet (25 mg total) by mouth 2 (two) times daily. 05/03/15   Hongalgi, Maximino Greenland, MD  metroNIDAZOLE (FLAGYL) 500 MG tablet Take 1 tablet (500 mg total) by mouth 2 (two) times daily. 12/07/16   Fayrene Helper, PA-C  ondansetron (ZOFRAN ODT) 8 MG disintegrating tablet Take 1 tablet (8 mg total) by mouth every 8 (eight) hours as needed for nausea or vomiting. 01/11/18   Azalia Bilis, MD  oxyCODONE-acetaminophen (PERCOCET/ROXICET) 5-325 MG tablet Take 1 tablet by mouth every 4 (four) hours as needed for severe pain. 01/11/18   Azalia Bilis, MD  promethazine (PHENERGAN) 25 MG tablet Take 1 tablet (25 mg total) by mouth every 6 (six) hours as needed for nausea. 12/07/16   Fayrene Helper, PA-C  tamsulosin (FLOMAX) 0.4 MG CAPS capsule Take 1 capsule (0.4 mg total) by mouth daily. 01/11/18   Azalia Bilis, MD  vitamin B-12 (CYANOCOBALAMIN)  1000 MCG tablet Take 1,000 mcg by mouth daily.    [provider]  vitamin C (ASCORBIC ACID) 500 MG tablet Take 500 mg by mouth daily.    [provider]    Family History No family history on file.  Social History Social History   Tobacco Use  . Smoking status: Never Smoker  . Smokeless tobacco: Never Used  Substance Use Topics  . Alcohol use: Yes  . Drug use: No     Allergies   Hydralazine hcl   Review of Systems Review of Systems  All other systems reviewed and are negative.    Physical Exam Updated Vital Signs BP  (!) 143/96   Pulse (!) 59   Temp 98.3 F (36.8 C) (Oral)   Resp 16   Ht 5\' 11"  (1.803 m)   Wt 90.7 kg   SpO2 98%   BMI 27.89 kg/m   Physical Exam  Constitutional: He is oriented to person, place, and time.  HENT:  Head: Normocephalic and atraumatic.  Eyes: EOM are normal.  Neck: Normal range of motion.  Cardiovascular: Normal rate, regular rhythm and intact distal pulses.  Pulmonary/Chest: Effort normal and breath sounds normal. No respiratory distress.  Abdominal: He exhibits no distension. There is no tenderness.  Musculoskeletal: Normal range of motion.  Neurological: He is alert and oriented to person, place, and time.  Skin: Skin is warm and dry.  Nursing note and vitals reviewed.    ED Treatments / Results  Labs (all labs ordered are listed, but only abnormal results are displayed) Labs Reviewed  URINALYSIS, ROUTINE W REFLEX MICROSCOPIC - Abnormal; Notable for the following components:      Result Value   Color, Urine STRAW (*)    Hgb urine dipstick MODERATE (*)    All other components within normal limits  BASIC METABOLIC PANEL - Abnormal; Notable for the following components:   Glucose, Bld 109 (*)    Creatinine, Ser 1.34 (*)    All other components within normal limits  CBC    EKG None  Radiology Ct Renal Stone Study  Result Date: 01/11/2018 CLINICAL DATA:  RIGHT-sided flank pain which began earlier today. EXAM: CT ABDOMEN AND PELVIS WITHOUT CONTRAST TECHNIQUE: Multidetector CT imaging of the abdomen and pelvis was performed following the standard protocol without IV contrast. COMPARISON:  None. FINDINGS: Lower chest: No acute abnormality. Hepatobiliary: No focal liver abnormality is seen. No gallstones, gallbladder wall thickening, or biliary dilatation. Pancreas: Unremarkable. No pancreatic ductal dilatation or surrounding inflammatory changes. Spleen: Normal in size without focal abnormality. Adrenals/Urinary Tract: RIGHT hydronephrosis and hydroureter  secondary to a partially obstructing 3 mm RIGHT UVJ calculus. No LEFT hydronephrosis. No nephrolithiasis. Simple appearing cyst of the lower pole RIGHT kidney. The bladder is unremarkable. Stomach/Bowel: Stomach is within normal limits. Appendix appears normal. No evidence of bowel wall thickening, distention, or inflammatory changes. Vascular/Lymphatic: No significant vascular findings are present. No enlarged abdominal or pelvic lymph nodes. Reproductive: Prostate is unremarkable. Other: No abdominal wall hernia or abnormality. No abdominopelvic ascites. Musculoskeletal: No acute or significant osseous findings. IMPRESSION: RIGHT hydronephrosis and hydroureter secondary to a partially obstructing 3 mm RIGHT UVJ calculus. Electronically Signed   By: Elsie Stain M.D.   On: 01/11/2018 20:56    Procedures Procedures (including critical care time)  Medications Ordered in ED Medications  ketorolac (TORADOL) 30 MG/ML injection 30 mg (30 mg Intravenous Given 01/11/18 2021)  morphine 4 MG/ML injection 4 mg (4 mg Intravenous Given 01/11/18 2022)  Initial Impression / Assessment and Plan / ED Course  I have reviewed the triage vital signs and the nursing notes.  Pertinent labs & imaging results that were available during my care of the patient were reviewed by me and considered in my medical decision making (see chart for details).     Right ureteral stone.  CT imaging reviewed by myself.  Mild right-sided hydro-.  Pain improved in the emergency department.  Discharged home in good condition.  Standard stone precautions.  Urology follow-up.  [facility] for new or worsening symptoms  Final Clinical Impressions(s) / ED Diagnoses   Final diagnoses:  Right ureteral stone    ED Discharge Orders         Ordered    ondansetron (ZOFRAN ODT) 8 MG disintegrating tablet  Every 8 hours PRN     01/11/18 2217    oxyCODONE-acetaminophen  (PERCOCET/ROXICET) 5-325 MG tablet  Every 4 hours PRN     01/11/18 2217    tamsulosin (FLOMAX) 0.4 MG CAPS capsule  Daily     01/11/18 2217           Azalia Bilisampos, Fleeta Kunde, MD 01/12/18 [phone]

## 2019-04-02 ENCOUNTER — Other Ambulatory Visit: Payer: Self-pay

## 2019-04-02 ENCOUNTER — Ambulatory Visit (INDEPENDENT_AMBULATORY_CARE_PROVIDER_SITE_OTHER): Payer: Managed Care, Other (non HMO)

## 2019-04-02 ENCOUNTER — Encounter (HOSPITAL_COMMUNITY): Payer: Self-pay

## 2019-04-02 ENCOUNTER — Ambulatory Visit (HOSPITAL_COMMUNITY)
Admission: EM | Admit: 2019-04-02 | Discharge: 2019-04-02 | Disposition: A | Discharge status: Home / Self Care | Payer: Managed Care, Other (non HMO) | Attending: Family Medicine | Admitting: Family Medicine

## 2019-04-02 DIAGNOSIS — R079 Chest pain, unspecified: Secondary | ICD-10-CM | POA: Diagnosis not present

## 2019-04-02 DIAGNOSIS — I10 Essential (primary) hypertension: Secondary | ICD-10-CM | POA: Diagnosis not present

## 2019-04-02 DIAGNOSIS — R0789 Other chest pain: Secondary | ICD-10-CM

## 2019-04-02 NOTE — Discharge Instructions (Addendum)
Your chest x ray is normal Your EKG is normal Blood pressure is great Follow up with your PCP

## 2019-04-02 NOTE — ED Provider Notes (Signed)
[facility]    CSN: [id number] Arrival date & time: 04/02/19  1634      History   Chief Complaint Chief Complaint  Patient presents with  . Chest Pain    HPI Kyle Dominguez is a 49 y.o. male.   HPI  Patient states that 3 or 4 days ago he had some chest pain.  It was in his left lower chest.  Radiated into his left arm.  He states the spells lasted for anywhere from a couple minutes to 15 minutes.  They were associated with a little bit of anxiety.  He had no diaphoresis, no shortness of breath, no pain into the neck or jaw.  He states that happened while he was resting.  He never has any problem with exertion.  He does work at a job where he is physically active.  He has been able to maintain his full activities without difficulty. He does have coronary risk factors of hypertension, hyperlipidemia, and prediabetes.  He is a non-smoker. He called his PCP for visit today.  He had a televisit.  The primary care doctor told him to go directly to the emergency room for evaluation.  The patient has misunderstood this instruction, and is here for evaluation.  He states he currently is not having any chest pain.  Past Medical History:  Diagnosis Date  . FRACTURE, RIB, LEFT 02/18/2010   Pt with recent trauma to ribs with subsequent fracture. Incident was work related. Is seeing workman's comp appointment MD about incident. Deferring care to [facility] comp physician.    Marland Kitchen Hx of migraines   . Hypertension     Patient Active Problem List   Diagnosis Date Noted  . Abdominal pain, RLQ 03/04/2014  . Lymphadenopathy, submandibular 05/23/2013  . Chest pain 02/01/2013  . 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 Anxiety and depression 10/23/2010  . HTN (hypertension) 06/17/2010  . Allergic rhinitis 04/30/2010  . Screening for lipoid disorders 04/07/2010  . LOW BACK PAIN, CHRONIC 01/28/2010  . MUSCLE CRAMPS 01/28/2010  . MIGRAINE HEADACHE 04/25/2009    History reviewed. No  pertinent surgical history.     Home Medications    Prior to Admission medications   Medication Sig Start Date End Date Taking? Authorizing Provider  amLODipine (NORVASC) 10 MG tablet Take 0.5 tablets (5 mg total) by mouth daily. Please refer future refills to Primary Care doctor. 05/05/16  Yes Janetta Hora, PA-C  hydrochlorothiazide (HYDRODIURIL) 12.5 MG tablet Take 12.5 mg by mouth daily. 02/17/19  Yes [provider]  acetaminophen (TYLENOL) 500 MG tablet Take 500-1,000 mg by mouth every 6 (six) hours as needed for mild pain or headache.    [provider]  atorvastatin (LIPITOR) 10 MG tablet Take 10 mg by mouth daily. 02/17/19   [provider]  ibuprofen (ADVIL,MOTRIN) 200 MG tablet Take 400 mg by mouth every 6 (six) hours as needed for headache or mild pain.    [provider]  tamsulosin (FLOMAX) 0.4 MG CAPS capsule Take 1 capsule (0.4 mg total) by mouth daily. 01/11/18   Azalia Bilis, MD  vitamin B-12 (CYANOCOBALAMIN) 1000 MCG tablet Take 1,000 mcg by mouth daily.    [provider]  vitamin C (ASCORBIC ACID) 500 MG tablet Take 500 mg by mouth daily.    [provider]  metoprolol tartrate (LOPRESSOR) 25 MG tablet Take 1 tablet (25 mg total) by mouth 2 (two) times daily. 05/03/15 04/02/19  Elease Etienne, MD  promethazine (  PHENERGAN) 25 MG tablet Take 1 tablet (25 mg total) by mouth every 6 (six) hours as needed for nausea. 12/07/16 04/02/19  Fayrene Helper, PA-C    Family History Family History  Problem Relation Age of Onset  . Healthy Mother   . Healthy Father     Social History Social History   Tobacco Use  . Smoking status: Never Smoker  . Smokeless tobacco: Never Used  Substance Use Topics  . Alcohol use: Not Currently  . Drug use: No     Allergies   Hydralazine hcl   Review of Systems Review of Systems  Constitutional: Negative for activity change and appetite change.  Respiratory: Negative for  shortness of breath.   Cardiovascular: Positive for chest pain. Negative for palpitations and leg swelling.  Gastrointestinal: Negative for abdominal distention and abdominal pain.  Psychiatric/Behavioral: The patient is nervous/anxious.      Physical Exam Triage Vital Signs ED Triage Vitals  Enc Vitals Group     BP 04/02/19 1802 125/79     Pulse Rate 04/02/19 1802 70     Resp 04/02/19 1802 16     Temp 04/02/19 1802 98 F (36.7 C)     Temp Source 04/02/19 1802 Oral     SpO2 04/02/19 1802 100 %     Weight --      Height --      Head Circumference --      Peak Flow --      Pain Score 04/02/19 1759 0     Pain Loc --      Pain Edu? --      Excl. in GC? --    No data found.  Updated Vital Signs BP 125/79 (BP Location: Right Arm)   Pulse 70   Temp 98 F (36.7 C) (Oral)   Resp 16   SpO2 100%      Physical Exam Constitutional:      General: He is not in acute distress.    Appearance: Normal appearance. He is well-developed and normal weight.  HENT:     Head: Normocephalic and atraumatic.     Mouth/Throat:     Comments: Mask in place Eyes:     Conjunctiva/sclera: Conjunctivae normal.     Pupils: Pupils are equal, round, and reactive to light.  Cardiovascular:     Rate and Rhythm: Normal rate and regular rhythm.     Heart sounds: Normal heart sounds. No murmur.  Pulmonary:     Effort: Pulmonary effort is normal. No respiratory distress.     Breath sounds: Normal breath sounds.  Abdominal:     General: There is no distension.     Palpations: Abdomen is soft.  Musculoskeletal:        General: Normal range of motion.     Cervical back: Normal range of motion.  Skin:    General: Skin is warm and dry.  Neurological:     General: No focal deficit present.     Mental Status: He is alert.  Psychiatric:        Mood and Affect: Mood normal.        Behavior: Behavior normal.   Heart is regular with no murmur gallop.  Lungs are clear.  Physical examination is  normal.   UC Treatments / Results  Labs (all labs ordered are listed, but only abnormal results are displayed) Labs Reviewed - No data to display  EKG normal.  Normal intervals.  Normal rate and rhythm.  No ST  or T wave changes   Radiology DG Chest 2 View  Result Date: 04/02/2019 CLINICAL DATA:  Chest pain for 4 days, hypertension EXAM: CHEST - 2 VIEW COMPARISON:  04/30/2015 FINDINGS: The heart size and mediastinal contours are within normal limits. Both lungs are clear. The visualized skeletal structures are unremarkable. IMPRESSION: No active cardiopulmonary disease. Electronically Signed   By: Randa Ngo M.D.   On: 04/02/2019 18:44    Procedures Procedures (including critical care time)  Medications Ordered in UC Medications - No data to display  Initial Impression / Assessment and Plan / UC Course  I have reviewed the triage vital signs and the nursing notes.  Pertinent labs & imaging results that were available during my care of the patient were reviewed by me and considered in my medical decision making (see chart for details).      Final Clinical Impressions(s) / UC Diagnoses   Final diagnoses:  Chest pain, unspecified type     Discharge Instructions     Your chest x ray is normal Your EKG is normal Blood pressure is great Follow up with your PCP    ED Prescriptions    None     PDMP not reviewed this encounter.   Raylene Everts, MD 04/02/19 2121

## 2019-04-02 NOTE — ED Triage Notes (Signed)
Talked to pt in lobby.  He has been having centralized chest pain with some left upper arm numbness that started on Thursday.  Pt is not having chest pain or numbness today.  Pt had a virtual visit with his PCP and they recommended he come here for an EKG.  Pt is in NAD.

## 2019-04-02 NOTE — ED Triage Notes (Signed)
Patient presents to Urgent Care with complaints of chest pain since 4 days ago. Patient reports the pain lasted 2-3 days, pt is not in pain at this time. This has not happened in the past, pt has HTN but has been taking his medication as directed.

## 2019-09-14 ENCOUNTER — Encounter (HOSPITAL_COMMUNITY): Payer: Self-pay | Admitting: Emergency Medicine

## 2019-09-14 ENCOUNTER — Other Ambulatory Visit: Payer: Self-pay

## 2019-09-14 ENCOUNTER — Ambulatory Visit (HOSPITAL_COMMUNITY)
Admission: EM | Admit: 2019-09-14 | Discharge: 2019-09-14 | Disposition: A | Discharge status: Home / Self Care | Payer: Self-pay | Attending: Family Medicine | Admitting: Family Medicine

## 2019-09-14 DIAGNOSIS — X509XXA Other and unspecified overexertion or strenuous movements or postures, initial encounter: Secondary | ICD-10-CM

## 2019-09-14 DIAGNOSIS — E860 Dehydration: Secondary | ICD-10-CM

## 2019-09-14 NOTE — ED Provider Notes (Signed)
[facility]    CSN: [id number] Arrival date & time: 09/14/19  1707      History   Chief Complaint Chief Complaint  Patient presents with  . Excessive Sweating    HPI Kyle Dominguez is a 49 y.o. male.   Kyle Dominguez presents with complaints of episode yesterday of cramping to legs, arms, back of neck, after he was very hot and sweaty from working physically outside. He endorses not taking enough breaks and drinking enough. Yesterday felt dizzy. Didn't go to work today. No further cramps. Now muscles feel sore where they had more intensely cramped, but overall feels better. No dizziness. No difficulty urinating, urine has remained yellow- no dark color. No chest pain . No shortness of breath.    ROS per HPI, negative if not otherwise mentioned.      Past Medical History:  Diagnosis Date  . FRACTURE, RIB, LEFT 02/18/2010   Pt with recent trauma to ribs with subsequent fracture. Incident was work related. Is seeing workman's comp appointment MD about incident. Deferring care to [facility] comp physician.    Marland Kitchen Hx of migraines   . Hypertension     Patient Active Problem List   Diagnosis Date Noted  . Abdominal pain, RLQ 03/04/2014  . Lymphadenopathy, submandibular 05/23/2013  . Chest pain 02/01/2013  . 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 Anxiety and depression 10/23/2010  . HTN (hypertension) 06/17/2010  . Allergic rhinitis 04/30/2010  . Screening for lipoid disorders 04/07/2010  . LOW BACK PAIN, CHRONIC 01/28/2010  . MUSCLE CRAMPS 01/28/2010  . MIGRAINE HEADACHE 04/25/2009    History reviewed. No pertinent surgical history.     Home Medications    Prior to Admission medications   Medication Sig Start Date End Date Taking? Authorizing Provider  acetaminophen (TYLENOL) 500 MG tablet Take 500-1,000 mg by mouth every 6 (six) hours as needed for mild pain or headache.   Yes [provider]  amLODipine (NORVASC) 10 MG tablet Take 0.5  tablets (5 mg total) by mouth daily. Please refer future refills to Primary Care doctor. 05/05/16  Yes Janetta Hora, PA-C  atorvastatin (LIPITOR) 10 MG tablet Take 10 mg by mouth daily. 02/17/19  Yes [provider]  hydrochlorothiazide (HYDRODIURIL) 12.5 MG tablet Take 12.5 mg by mouth daily. 02/17/19  Yes [provider]  ibuprofen (ADVIL,MOTRIN) 200 MG tablet Take 400 mg by mouth every 6 (six) hours as needed for headache or mild pain.   Yes [provider]  vitamin B-12 (CYANOCOBALAMIN) 1000 MCG tablet Take 1,000 mcg by mouth daily.   Yes [provider]  vitamin C (ASCORBIC ACID) 500 MG tablet Take 500 mg by mouth daily.   Yes [provider]  tamsulosin (FLOMAX) 0.4 MG CAPS capsule Take 1 capsule (0.4 mg total) by mouth daily. 01/11/18   Azalia Bilis, MD  metoprolol tartrate (LOPRESSOR) 25 MG tablet Take 1 tablet (25 mg total) by mouth 2 (two) times daily. 05/03/15 04/02/19  Hongalgi, Maximino Greenland, MD  promethazine (PHENERGAN) 25 MG tablet Take 1 tablet (25 mg total) by mouth every 6 (six) hours as needed for nausea. 12/07/16 04/02/19  Fayrene Helper, PA-C    Family History Family History  Problem Relation Age of Onset  . Healthy Mother   . Healthy Father     Social History Social History   Tobacco Use  . Smoking status: Never Smoker  . Smokeless tobacco: Never Used  Vaping Use  . Vaping Use: Never used  Substance Use Topics  . Alcohol use: Not Currently  . Drug use: No     Allergies   Hydralazine hcl   Review of Systems Review of Systems   Physical Exam Triage Vital Signs ED Triage Vitals  Enc Vitals Group     BP 09/14/19 1908 (!) 137/101     Pulse Rate 09/14/19 1908 76     Resp 09/14/19 1908 18     Temp 09/14/19 1908 98.3 F (36.8 C)     Temp Source 09/14/19 1908 Oral     SpO2 09/14/19 1908 100 %     Weight --      Height --      Head Circumference --      Peak Flow --      Pain Score 09/14/19 1906 0     Pain Loc  --      Pain Edu? --      Excl. in GC? --    No data found.  Updated Vital Signs BP (!) 137/101 (BP Location: Right Arm)   Pulse 76   Temp 98.3 F (36.8 C) (Oral)   Resp 18   SpO2 100%    Physical Exam Constitutional:      Appearance: He is well-developed.  Cardiovascular:     Rate and Rhythm: Normal rate.  Pulmonary:     Effort: Pulmonary effort is normal.  Musculoskeletal:     Comments: No muscle soreness out of proportion to complaint ; full ROM and activity with extremities   Skin:    General: Skin is warm and dry.  Neurological:     Mental Status: He is alert and oriented to person, place, and time.      UC Treatments / Results  Labs (all labs ordered are listed, but only abnormal results are displayed) Labs Reviewed - No data to display  EKG   Radiology No results found.  Procedures Procedures (including critical care time)  Medications Ordered in UC Medications - No data to display  Initial Impression / Assessment and Plan / UC Course  I have reviewed the triage vital signs and the nursing notes.  Pertinent labs & imaging results that were available during my care of the patient were reviewed by me and considered in my medical decision making (see chart for details).     Dehydration yesterday with muscle cramping, working outside physical labor in the heat with inadequate hydration. Hydrating today. Endorses clear urine, no dizziness, no muscle pain. Urine, ck, cmp offered today and patient declines, will go home and hydrate with water and electrolytes. Hydration discussed for in the future. Return precautions provided. Patient verbalized understanding and agreeable to plan.    Final Clinical Impressions(s) / UC Diagnoses   Final diagnoses:  Dehydration  Overexertion, initial encounter     Discharge Instructions     Increase your fluid intake including water and electrolytes.  Rest.  Limit caffeine and/or alcohol.  Please return if you  become dizzy, weak, increased muscle pain, or notice your urine is orange/brown  Increase your fluid intake while at work, especially if out in the heat.    ED Prescriptions    None     PDMP not reviewed this encounter.   Georgetta Haber, NP 09/15/19 1013

## 2019-09-14 NOTE — ED Triage Notes (Signed)
Pt states yesterday at work he was outside and got overheated and had excessive sweating and body cramping all over. He states today he still has some leg cramping in both legs. 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

## 2019-09-14 NOTE — Discharge Instructions (Signed)
Increase your fluid intake including water and electrolytes.  Rest.  Limit caffeine and/or alcohol.  Please return if you become dizzy, weak, increased muscle pain, or notice your urine is orange/brown  Increase your fluid intake while at work, especially if out in the heat.

## 2020-08-25 ENCOUNTER — Other Ambulatory Visit: Payer: Self-pay

## 2020-08-25 ENCOUNTER — Ambulatory Visit (HOSPITAL_COMMUNITY)
Admission: EM | Admit: 2020-08-25 | Discharge: 2020-08-25 | Disposition: A | Discharge status: Home / Self Care | Payer: 59 | Attending: Urgent Care | Admitting: Urgent Care

## 2020-08-25 ENCOUNTER — Encounter (HOSPITAL_COMMUNITY): Payer: Self-pay

## 2020-08-25 DIAGNOSIS — J069 Acute upper respiratory infection, unspecified: Secondary | ICD-10-CM | POA: Diagnosis not present

## 2020-08-25 DIAGNOSIS — H9201 Otalgia, right ear: Secondary | ICD-10-CM | POA: Diagnosis not present

## 2020-08-25 DIAGNOSIS — I10 Essential (primary) hypertension: Secondary | ICD-10-CM | POA: Insufficient documentation

## 2020-08-25 DIAGNOSIS — Z20822 Contact with and (suspected) exposure to covid-19: Secondary | ICD-10-CM | POA: Insufficient documentation

## 2020-08-25 DIAGNOSIS — J029 Acute pharyngitis, unspecified: Secondary | ICD-10-CM

## 2020-08-25 LAB — SARS CORONAVIRUS 2 (TAT 6-24 HRS): SARS Coronavirus 2: NEGATIVE

## 2020-08-25 MED ORDER — BENZONATATE 100 MG PO CAPS: 100.0000 mg | ORAL_CAPSULE | Freq: Three times a day (TID) | ORAL | 0 refills | Status: DC | PRN

## 2020-08-25 MED ORDER — PROMETHAZINE-DM 6.25-15 MG/5ML PO SYRP: 5.0000 mL | ORAL_SOLUTION | Freq: Every evening | ORAL | 0 refills | Status: DC | PRN

## 2020-08-25 MED ORDER — FLUTICASONE PROPIONATE 50 MCG/ACT NA SUSP: 2.0000 | Freq: Every day | NASAL | 12 refills | Status: AC

## 2020-08-25 MED ORDER — CETIRIZINE HCL 10 MG PO TABS: 10.0000 mg | ORAL_TABLET | Freq: Every day | ORAL | 0 refills | Status: AC

## 2020-08-25 MED ORDER — AMLODIPINE BESYLATE 5 MG PO TABS: 5.0000 mg | ORAL_TABLET | Freq: Every day | ORAL | 0 refills | Status: DC

## 2020-08-25 MED ORDER — PSEUDOEPHEDRINE HCL 30 MG PO TABS: 30.0000 mg | ORAL_TABLET | Freq: Three times a day (TID) | ORAL | 0 refills | Status: AC | PRN

## 2020-08-25 NOTE — ED Triage Notes (Signed)
Pt presents with cough,  right ear pain and sore throat x 3 days. Denies fever.

## 2020-08-25 NOTE — Discharge Instructions (Addendum)
We will notify you of your COVID-19 test results as they arrive and may take between 24 to 48 hours.  I encourage you to sign up for MyChart if you have not already done so as this can be the easiest way for [place] to communicate results to you online or through a phone app.  In the meantime, if you develop worsening symptoms including fever, chest pain, shortness of breath despite our current treatment plan then please report to the emergency room as this may be a sign of worsening status from possible COVID-19 infection.  Otherwise, we will manage this as a viral syndrome. For sore throat or cough try using a honey-based tea. Use 3 teaspoons of honey with juice squeezed from half lemon. Place shaved pieces of ginger into 1/2-1 cup of water and warm over stove top. Then mix the ingredients and repeat every 4 hours as needed. Please take Tylenol 500mg -650mg  every 6 hours for aches and pains, fevers. Hydrate very well with at least 2 liters of water. Eat light meals such as soups to replenish electrolytes and soft fruits, veggies. Start an antihistamine like Zyrtec, Allegra or Claritin for postnasal drainage, sinus congestion.  You can take this together with pseudoephedrine (Sudafed) at a dose of 30 mg 2-3 times a day as needed for the same kind of congestion.     For diabetes or elevated blood sugar, please make sure you are limiting and avoiding starchy, carbohydrate foods like pasta, breads, sweet breads, pastry, rice, potatoes, desserts. These foods can elevate your blood sugar. Also, limit and avoid drinks that contain a lot of sugar such as sodas, sweet teas, fruit juices.  Drinking plain water will be much more helpful, try 64 ounces of water daily.  It is okay to flavor your water naturally by cutting cucumber, lemon, mint or lime, placing it in a picture with water and drinking it over a period of 24-48 hours as long as it remains refrigerated.  For elevated blood pressure, make sure you are monitoring  salt in your diet.  Do not eat restaurant foods and limit processed foods at home. I highly recommend you prepare and cook your own foods at home.  Processed foods include things like frozen meals, pre-seasoned meats and dinners, deli meats, canned foods as these foods contain a high amount of sodium/salt.  Make sure you are paying attention to sodium labels on foods you buy at the grocery store. Buy your spices separately such as garlic powder, onion powder, cumin, cayenne, parsley flakes so that you can avoid seasonings that contain salt. However, salt-free seasonings are available and can be used, an example is Mrs. Dash and includes a lot of different mixtures that do not contain salt.  Lastly, when cooking using oils that are healthier for you is important. This includes olive oil, avocado oil, canola oil. We have discussed a lot of foods to avoid but below is a list of foods that can be very healthy to use in your diet whether it is for diabetes, cholesterol, high blood pressure, or in general healthy eating.  Salads - kale, spinach, cabbage, spring mix, arugula Fruits - avocadoes, berries (blueberries, raspberries, blackberries), apples, oranges, pomegranate, grapefruit, kiwi Vegetables - asparagus, cauliflower, broccoli, green beans, brussel sprouts, bell peppers, beets; stay away from or limit starchy vegetables like potatoes, carrots, peas Other general foods - kidney beans, egg whites, almonds, walnuts, sunflower seeds, pumpkin seeds, fat free yogurt, almond milk, flax seeds, quinoa, oats  Meat - It  is better to eat lean meats and limit your red meat including pork to once a week.  Wild caught fish, chicken breast are good options as they tend to be leaner sources of good protein. Still be mindful of the sodium labels for the meats you buy.  DO NOT EAT ANY FOODS ON THIS LIST THAT YOU ARE ALLERGIC TO. For more specific needs, I highly recommend consulting a dietician or nutritionist but this can  definitely be a good starting point.

## 2020-08-25 NOTE — ED Provider Notes (Addendum)
[facility]   MRN: [id number] DOB: Jul 02, 1970  Subjective:   Kyle Dominguez is a 50 y.o. male presenting for 3-day history of acute onset right ear pain, throat pain, malaise and fatigue.  Patient just traveled back from [place].  He is COVID vaccinated, no booster.  Denies history of respiratory disorders, asthma, smoking history.  Denies chest pain, shortness of breath.  He does have a history of allergic rhinitis but is not taking anything consistently for this.  Regarding his blood pressure, patient stopped taking his blood pressure medications on his own and has not followed up with his PCP.  States that they had previously discussed coming off of it altogether and not getting back on it.  Denies taking chronic medications.  Allergies  Allergen Reactions   Hydralazine Hcl Nausea And Vomiting and Other (See Comments)    Headache/diaphoresis    Past Medical History:  Diagnosis Date   FRACTURE, RIB, LEFT 02/18/2010   Pt with recent trauma to ribs with subsequent fracture. Incident was work related. Is seeing workman's comp appointment MD about incident. Deferring care to [facility] comp physician.     Hx of migraines    Hypertension      History reviewed. No pertinent surgical history.  Family History  Problem Relation Age of Onset   Healthy Mother    Healthy Father     Social History   Tobacco Use   Smoking status: Never   Smokeless tobacco: Never  Vaping Use   Vaping Use: Never used  Substance Use Topics   Alcohol use: Not Currently   Drug use: No    ROS   Objective:   Vitals: BP (!) 148/96 (BP Location: Right Arm)   Pulse 81   Temp 99.1 F (37.3 C) (Oral)   Resp 18   SpO2 95%   BP Readings from Last 3 Encounters:  08/25/20 (!) 148/96  09/14/19 (!) 137/101  04/02/19 125/79   Physical Exam Constitutional:      General: He is not in acute distress.    Appearance: Normal appearance. He is well-developed and normal weight. He is  not ill-appearing, toxic-appearing or diaphoretic.  HENT:     Head: Normocephalic and atraumatic.     Right Ear: Tympanic membrane, ear canal and external ear normal. There is no impacted cerumen.     Left Ear: Tympanic membrane, ear canal and external ear normal. There is no impacted cerumen.     Nose: Nose normal. No congestion or rhinorrhea.     Mouth/Throat:     Mouth: Mucous membranes are moist.     Pharynx: No oropharyngeal exudate or posterior oropharyngeal erythema.     Comments: Post-nasal drainage overlying pharynx.  Eyes:     General: No scleral icterus.       Right eye: No discharge.        Left eye: No discharge.     Extraocular Movements: Extraocular movements intact.     Conjunctiva/sclera: Conjunctivae normal.     Pupils: Pupils are equal, round, and reactive to light.  Cardiovascular:     Rate and Rhythm: Normal rate and regular rhythm.     Heart sounds: Normal heart sounds. No murmur heard.   No friction rub. No gallop.  Pulmonary:     Effort: Pulmonary effort is normal. No respiratory distress.     Breath sounds: Normal breath sounds. No stridor. No wheezing, rhonchi or rales.  Musculoskeletal:     Cervical back: Normal range  of motion and neck supple. No rigidity. No muscular tenderness.  Neurological:     General: No focal deficit present.     Mental Status: He is alert and oriented to person, place, and time.     Cranial Nerves: No cranial nerve deficit.     Motor: No weakness.     Coordination: Coordination normal.     Gait: Gait normal.  Psychiatric:        Mood and Affect: Mood normal.        Behavior: Behavior normal.        Thought Content: Thought content normal.      Assessment and Plan :   PDMP not reviewed this encounter.  1. Viral URI with cough   2. Sore throat   3. Right ear pain   4. Essential hypertension     Will manage for viral illness such as viral URI, viral syndrome, viral rhinitis, COVID-19. Counseled patient on nature of  COVID-19 including modes of transmission, diagnostic testing, management and supportive care.  Offered scripts for symptomatic relief. Recommended restarting amlodipine for HTN. Follow up with PCP asap. COVID 19 testing is pending. Counseled patient on potential for adverse effects with medications prescribed/recommended today, ER and return-to-clinic precautions discussed, patient verbalized understanding.      Wallis Bamberg, PA-C 08/25/20 1159

## 2021-05-06 ENCOUNTER — Other Ambulatory Visit: Payer: Self-pay

## 2021-05-06 ENCOUNTER — Ambulatory Visit: Payer: Self-pay

## 2021-05-06 ENCOUNTER — Encounter (HOSPITAL_COMMUNITY): Payer: Self-pay

## 2021-05-06 ENCOUNTER — Ambulatory Visit (HOSPITAL_COMMUNITY)
Admission: RE | Admit: 2021-05-06 | Discharge: 2021-05-06 | Disposition: A | Discharge status: Home / Self Care | Payer: Self-pay | Source: Ambulatory Visit | Attending: Physician Assistant | Admitting: Physician Assistant

## 2021-05-06 VITALS — BP 159/99 | HR 74 | Temp 98.5°F | Resp 15

## 2021-05-06 DIAGNOSIS — H5789 Other specified disorders of eye and adnexa: Secondary | ICD-10-CM

## 2021-05-06 DIAGNOSIS — H04301 Unspecified dacryocystitis of right lacrimal passage: Secondary | ICD-10-CM

## 2021-05-06 MED ORDER — TETRACAINE HCL 0.5 % OP SOLN
OPHTHALMIC | Status: AC
  Filled 2021-05-06: qty 4

## 2021-05-06 MED ORDER — ERYTHROMYCIN 5 MG/GM OP OINT: TOPICAL_OINTMENT | OPHTHALMIC | 0 refills | Status: AC

## 2021-05-06 MED ORDER — AMOXICILLIN-POT CLAVULANATE 875-125 MG PO TABS: 1.0000 | ORAL_TABLET | Freq: Two times a day (BID) | ORAL | 0 refills | Status: AC

## 2021-05-06 NOTE — Discharge Instructions (Signed)
Start Augmentin twice daily for 10 days.  Use a warm compress to keep the eye clean.  Apply erythromycin ointment twice daily.  Use lubricating eyedrops for additional symptom relief.  If symptoms or not improving quickly please follow-up with ophthalmology; call to schedule appointment.  If you have any worsening symptoms including pain with moving your eye, fever, nausea/vomiting, headache, spreading redness around her eye you need to be seen immediately. ?

## 2021-05-06 NOTE — ED Triage Notes (Signed)
Pt c/o right eye being red, swollen, pain for couple days. Reports that watering and little crust when wakes up.  ?

## 2021-05-06 NOTE — ED Provider Notes (Signed)
?[facility] ? ? ? ?CSN: [id number] ?Arrival date & time: 05/06/21  1045 ? ? ?  ? ?History   ?Chief Complaint ?Chief Complaint  ?Patient presents with  ? appt 1100  ? Eye Pain  ? ? ?HPI ?Kyle Dominguez is a 51 y.o. male.  ? ?Patient presents today with a several day history of eye irritation and watering.  He does not wear glasses or contacts.  Denies any visual disturbance, headache, nausea, vomiting, fever.  Denies any recent illness including cough, congestion, sinus symptoms.  Denies any recent antibiotic use.  He has not tried any over-the-counter medications for symptom management.  He has been using warm compresses with minimal improvement of symptoms.  Denies any exposure to chemicals or fine particulate matter.  Does report a foreign body sensation under his upper eyelid.  He does not follow with an ophthalmologist at this time. ? ? ?Past Medical History:  ?Diagnosis Date  ? FRACTURE, RIB, LEFT 02/18/2010  ? Pt with recent trauma to ribs with subsequent fracture. Incident was work related. Is seeing workman's comp appointment MD about incident. Deferring care to [facility] comp physician.    ? Hx of migraines   ? Hypertension   ? ? ?Patient Active Problem List  ? Diagnosis Date Noted  ? Abdominal pain, RLQ 03/04/2014  ? Lymphadenopathy, submandibular 05/23/2013  ? Chest pain 02/01/2013  ? 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 Anxiety and depression 10/23/2010  ? HTN (hypertension) 06/17/2010  ? Allergic rhinitis 04/30/2010  ? Screening for lipoid disorders 04/07/2010  ? LOW BACK PAIN, CHRONIC 01/28/2010  ? MUSCLE CRAMPS 01/28/2010  ? MIGRAINE HEADACHE 04/25/2009  ? ? ?History reviewed. No pertinent surgical history. ? ? ? ? ?Home Medications   ? ?Prior to Admission medications   ?Medication Sig Start Date End Date Taking? Authorizing Provider  ?amoxicillin-clavulanate (AUGMENTIN) 875-125 MG tablet Take 1 tablet by mouth every 12 (twelve) hours for 10 days. 05/06/21 05/16/21 Yes Ebony Yorio, Noberto Retort, PA-C   ?erythromycin ophthalmic ointment Place a 1/2 inch ribbon of ointment into the lower eyelid of right eye twice daily for 7 days 05/06/21  Yes Amario Longmore K, PA-C  ?acetaminophen (TYLENOL) 500 MG tablet Take 500-1,000 mg by mouth every 6 (six) hours as needed for mild pain or headache.    [provider]  ?amLODipine (NORVASC) 5 MG tablet Take 1 tablet (5 mg total) by mouth daily. 08/25/20   Wallis Bamberg, PA-C  ?atorvastatin (LIPITOR) 10 MG tablet Take 10 mg by mouth daily. 02/17/19   [provider]  ?benzonatate (TESSALON) 100 MG capsule Take 1-2 capsules (100-200 mg total) by mouth 3 (three) times daily as needed for cough. 08/25/20   Wallis Bamberg, PA-C  ?cetirizine (ZYRTEC ALLERGY) 10 MG tablet Take 1 tablet (10 mg total) by mouth daily. 08/25/20   Wallis Bamberg, PA-C  ?fluticasone (FLONASE) 50 MCG/ACT nasal spray Place 2 sprays into both nostrils daily. 08/25/20   Wallis Bamberg, PA-C  ?hydrochlorothiazide (HYDRODIURIL) 12.5 MG tablet Take 12.5 mg by mouth daily. 02/17/19   [provider]  ?ibuprofen (ADVIL,MOTRIN) 200 MG tablet Take 400 mg by mouth every 6 (six) hours as needed for headache or mild pain.    [provider]  ?promethazine-dextromethorphan (PROMETHAZINE-DM) 6.25-15 MG/5ML syrup Take 5 mLs by mouth at bedtime as needed for cough. 08/25/20   Wallis Bamberg, PA-C  ?pseudoephedrine (SUDAFED) 30 MG tablet Take 1 tablet (30 mg total) by mouth every 8 (eight) hours as needed for congestion.  08/25/20   Wallis Bamberg, PA-C  ?tamsulosin (FLOMAX) 0.4 MG CAPS capsule Take 1 capsule (0.4 mg total) by mouth daily. 01/11/18   Azalia Bilis, MD  ?vitamin B-12 (CYANOCOBALAMIN) 1000 MCG tablet Take 1,000 mcg by mouth daily.    [provider]  ?vitamin C (ASCORBIC ACID) 500 MG tablet Take 500 mg by mouth daily.    [provider]  ?metoprolol tartrate (LOPRESSOR) 25 MG tablet Take 1 tablet (25 mg total) by mouth 2 (two) times daily. 05/03/15 04/02/19  Elease Etienne, MD   ?promethazine (PHENERGAN) 25 MG tablet Take 1 tablet (25 mg total) by mouth every 6 (six) hours as needed for nausea. 12/07/16 04/02/19  Fayrene Helper, PA-C  ? ? ?Family History ?Family History  ?Problem Relation Age of Onset  ? Healthy Mother   ? Healthy Father   ? ? ?Social History ?Social History  ? ?Tobacco Use  ? Smoking status: Never  ? Smokeless tobacco: Never  ?Vaping Use  ? Vaping Use: Never used  ?Substance Use Topics  ? Alcohol use: Not Currently  ? Drug use: No  ? ? ? ?Allergies   ?Hydralazine hcl ? ? ?Review of Systems ?Review of Systems  ?Constitutional:  Positive for activity change. Negative for appetite change, fatigue and fever.  ?Eyes:  Positive for discharge, redness and itching. Negative for photophobia, pain and visual disturbance.  ?Respiratory:  Negative for cough and shortness of breath.   ?Cardiovascular:  Negative for chest pain.  ?Gastrointestinal:  Negative for abdominal pain, diarrhea, nausea and vomiting.  ?Neurological:  Negative for dizziness, light-headedness and headaches.  ? ? ?Physical Exam ?Triage Vital Signs ?ED Triage Vitals  ?Enc Vitals Group  ?   BP 05/06/21 1107 (!) 159/99  ?   Pulse Rate 05/06/21 1107 74  ?   Resp 05/06/21 1107 15  ?   Temp 05/06/21 1107 98.5 ?F (36.9 ?C)  ?   Temp Source 05/06/21 1107 Oral  ?   SpO2 05/06/21 1107 97 %  ?   Weight --   ?   Height --   ?   Head Circumference --   ?   Peak Flow --   ?   Pain Score 05/06/21 1105 7  ?   Pain Loc --   ?   Pain Edu? --   ?   Excl. in GC? --   ? ?No data found. ? ?Updated Vital Signs ?BP (!) 159/99 (BP Location: Left Arm)   Pulse 74   Temp 98.5 ?F (36.9 ?C) (Oral)   Resp 15   SpO2 97%  ? ?Visual Acuity ?Right Eye Distance:   ?Left Eye Distance:   ?Bilateral Distance:   ? ?Right Eye Near:   ?Left Eye Near:    ?Bilateral Near:    ? ?Physical Exam ?Vitals reviewed.  ?Constitutional:   ?   General: He is awake.  ?   Appearance: Normal appearance. He is well-developed. He is not ill-appearing.  ?   Comments: Very  pleasant male appears stated age in no acute distress sitting comfortably in exam room  ?HENT:  ?   Head: Normocephalic and atraumatic.  ?Eyes:  ?   General: Lids are everted, no foreign bodies appreciated.  ?   Extraocular Movements: Extraocular movements intact.  ?   Conjunctiva/sclera:  ?   Right eye: Right conjunctiva is injected.  ?   Left eye: Left conjunctiva is not injected.  ?   Pupils: Pupils are equal, round, and  reactive to light.  ?   Right eye: No corneal abrasion or fluorescein uptake.  ?   Comments: No pain with extraocular movements.  Erythema particularly at right lower nasal periorbital region.  Tearing noted throughout exam.  ?Cardiovascular:  ?   Rate and Rhythm: Normal rate and regular rhythm.  ?   Heart sounds: Normal heart sounds, S1 normal and S2 normal. No murmur heard. ?Pulmonary:  ?   Effort: Pulmonary effort is normal.  ?   Breath sounds: Normal breath sounds. No stridor. No wheezing, rhonchi or rales.  ?   Comments: Clear to auscultation bilaterally ?Neurological:  ?   Mental Status: He is alert.  ?Psychiatric:     ?   Behavior: Behavior is cooperative.  ? ? ? ?UC Treatments / Results  ?Labs ?(all labs ordered are listed, but only abnormal results are displayed) ?Labs Reviewed - No data to display ? ?EKG ? ? ?Radiology ?No results found. ? ?Procedures ?Procedures (including critical care time) ? ?Medications Ordered in UC ?Medications - No data to display ? ?Initial Impression / Assessment and Plan / UC Course  ?I have reviewed the triage vital signs and the nursing notes. ? ?Pertinent labs & imaging results that were available during my care of the patient were reviewed by me and considered in my medical decision making (see chart for details). ? ?  ? ?Fluorescein stain shows no corneal abrasion.  Eyelids were inverted with no foreign body but after this procedure patient reports foreign body sensation resolved.  Concern for dacryocystitis given clinical presentation so will cover with  Augmentin.  No concern for orbital cellulitis as patient is well-appearing, afebrile, no pain with extraocular movements.  He was encouraged to use warm compresses for additional symptom relief as well as over-the-coun

## 2021-12-19 IMAGING — DX DG CHEST 2V
2 series · 2 of 2 positions shown · non-contrast
Comparison: 04/30/2015

CLINICAL DATA: Chest pain for 4 days, hypertension

EXAM:
CHEST - 2 VIEW

[chest pa]
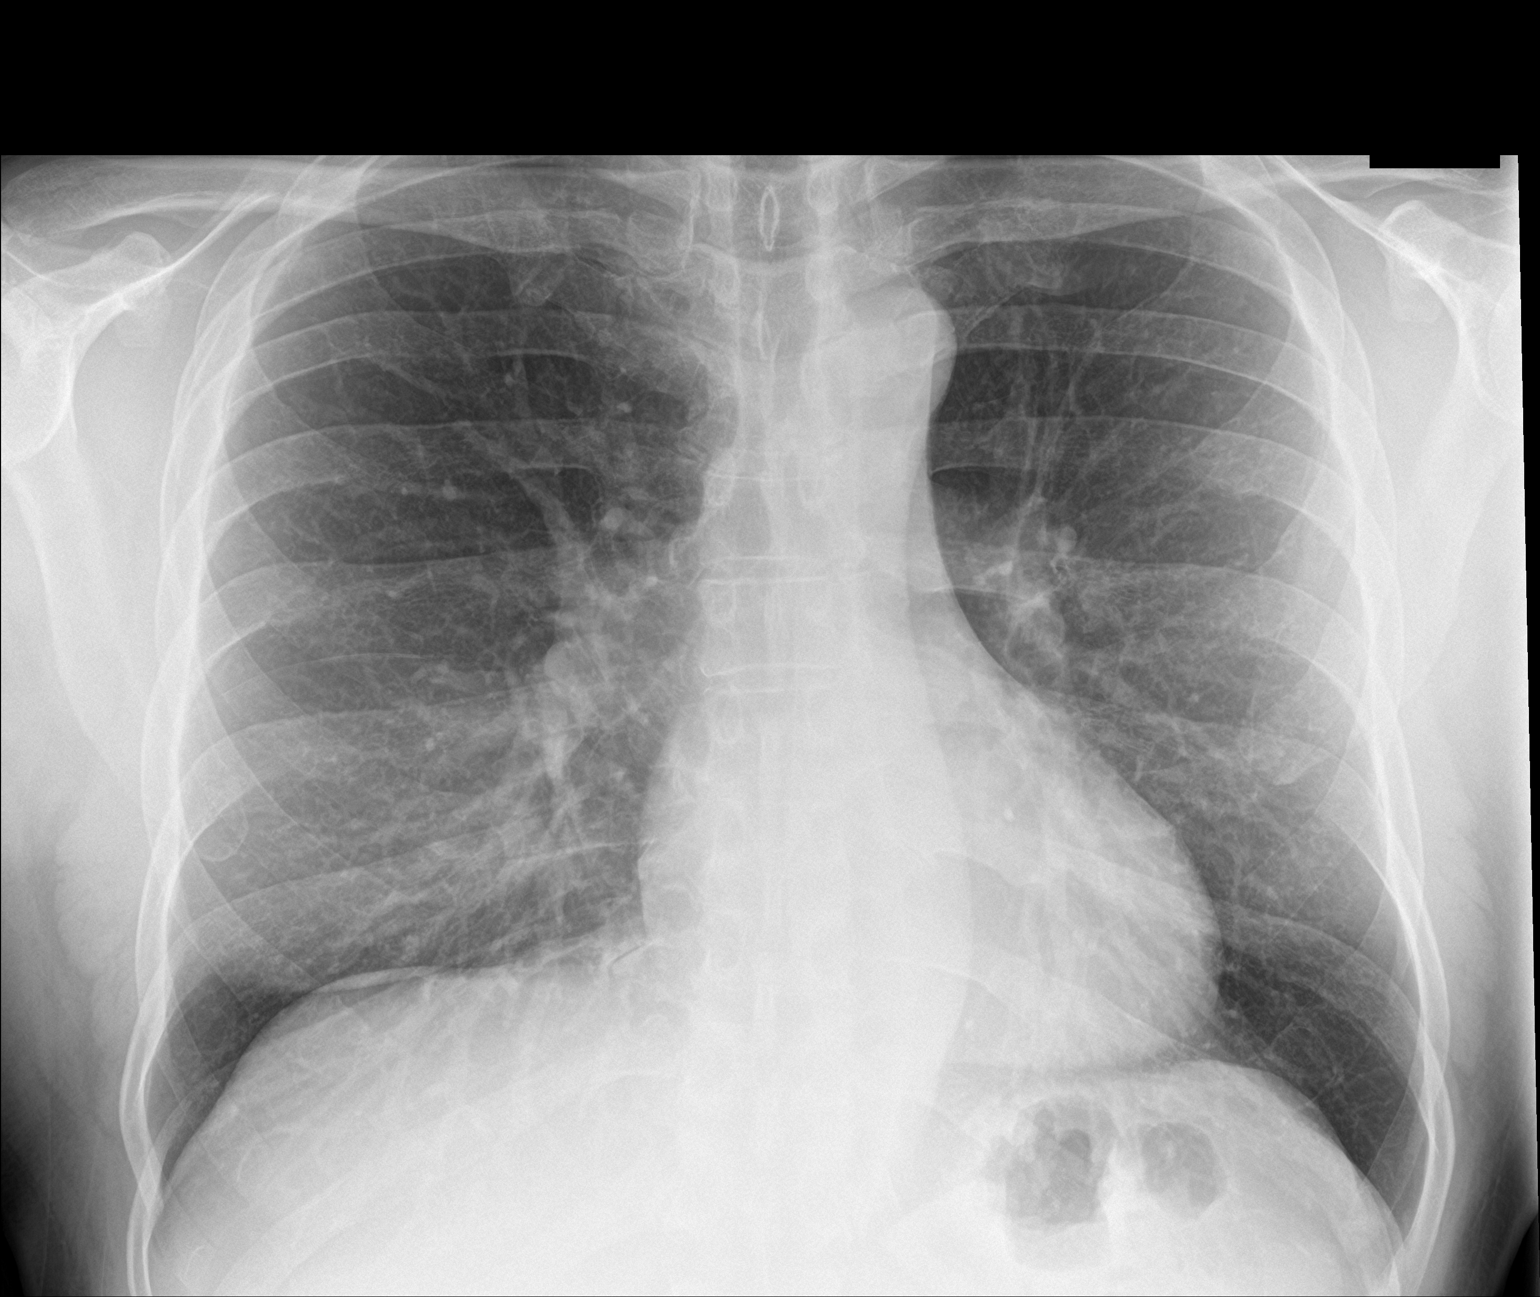

[chest lat]
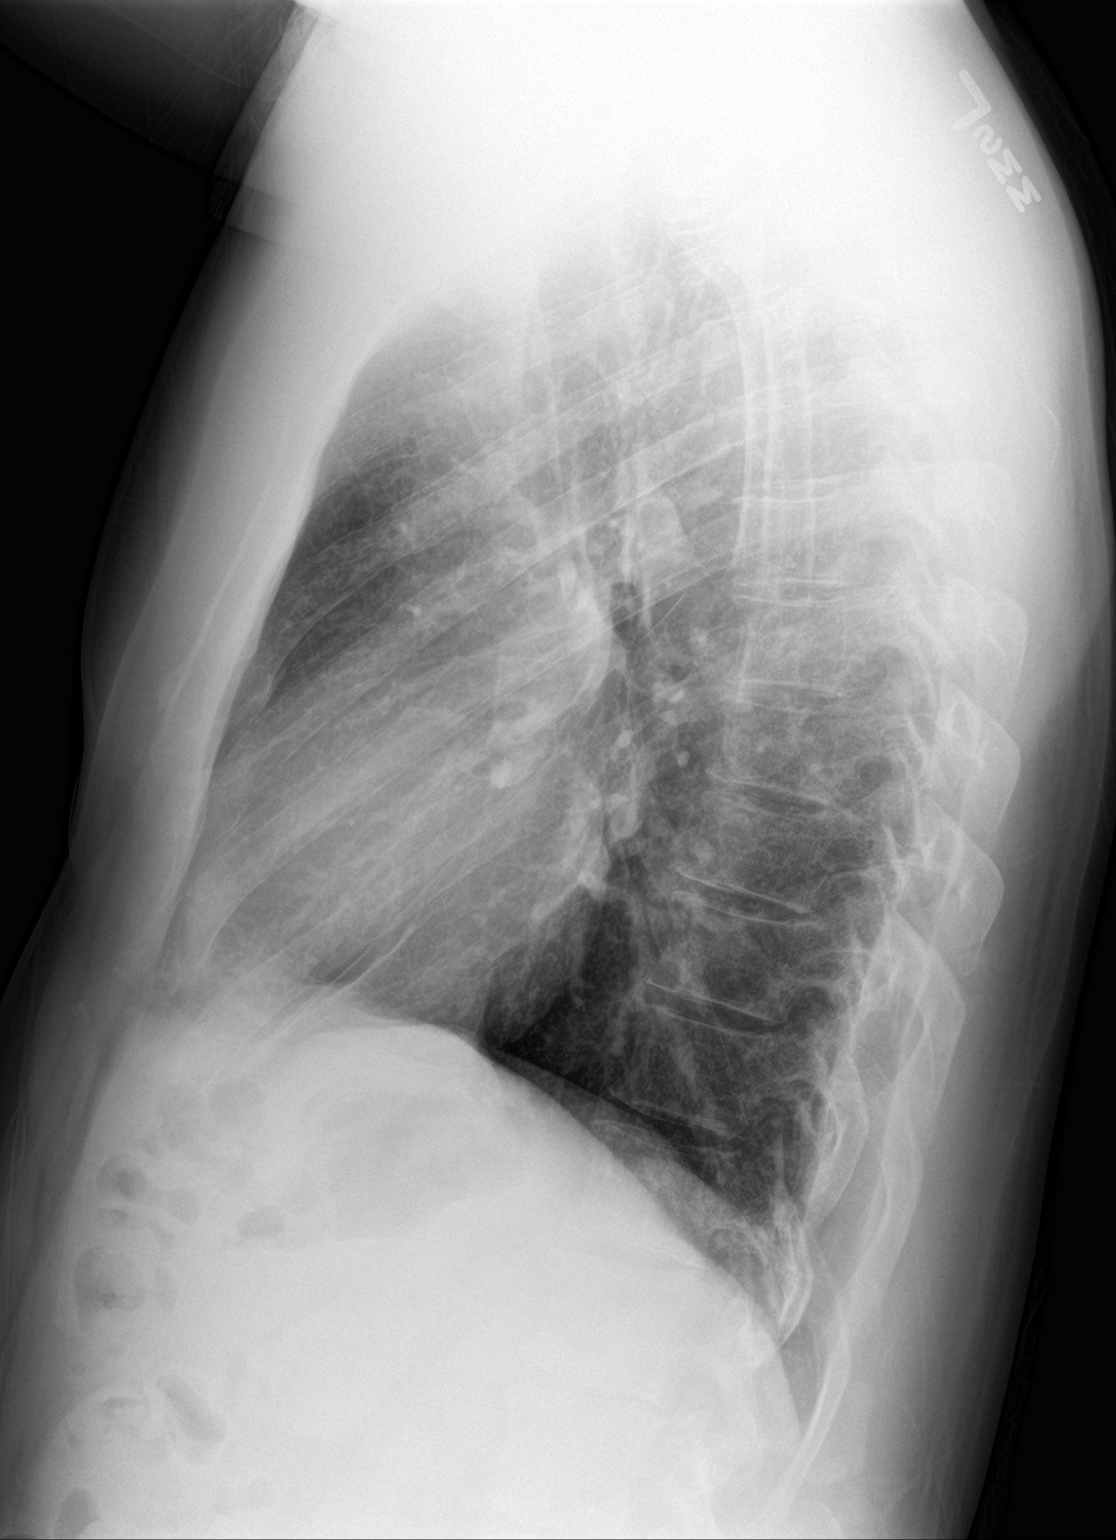

[2 of 2 positions shown; findings below may reference images not displayed]

FINDINGS: The heart size and mediastinal contours are within normal limits.
Both lungs are clear. The visualized skeletal structures are
unremarkable.
IMPRESSION: No active cardiopulmonary disease.

## 2022-01-01 ENCOUNTER — Encounter (HOSPITAL_COMMUNITY): Payer: Self-pay

## 2022-01-01 ENCOUNTER — Ambulatory Visit (HOSPITAL_COMMUNITY)
Admission: EM | Admit: 2022-01-01 | Discharge: 2022-01-01 | Disposition: A | Discharge status: Home / Self Care | Payer: Self-pay | Attending: Family Medicine | Admitting: Family Medicine

## 2022-01-01 DIAGNOSIS — I10 Essential (primary) hypertension: Secondary | ICD-10-CM

## 2022-01-01 DIAGNOSIS — M25511 Pain in right shoulder: Secondary | ICD-10-CM

## 2022-01-01 MED ORDER — AMLODIPINE BESYLATE 5 MG PO TABS: 5.0000 mg | ORAL_TABLET | Freq: Every day | ORAL | 0 refills | Status: AC

## 2022-01-01 MED ORDER — HYDROCHLOROTHIAZIDE 12.5 MG PO TABS: 12.5000 mg | ORAL_TABLET | Freq: Every day | ORAL | 0 refills | Status: AC

## 2022-01-01 MED ORDER — PREDNISONE 20 MG PO TABS: 40.0000 mg | ORAL_TABLET | Freq: Every day | ORAL | 0 refills | Status: AC

## 2022-01-01 NOTE — ED Provider Notes (Signed)
[facility]    CSN: [id number] Arrival date & time: 01/01/22  0840      History   Chief Complaint Chief Complaint  Patient presents with   Shoulder Pain   Hypertension    HPI Kyle Dominguez is a 51 y.o. male.   Patient is here for right shoulder pain.  Started 3 weeks ago.  No known injury, no heavy lifting per se.  Pain is located in the anterior portion of the shoulder, with lifting.  He did take tylenol with some help.   His bp has been elevated recently.  No headaches, blurry vision, dizziness.  He was on bp medication in the past.  It was lower and was advised to stop the medication.  Has not been on anything for about a year or so.  He was norvasc 5mg  and hctz 12.5mg .        Past Medical History:  Diagnosis Date   FRACTURE, RIB, LEFT 02/18/2010   Pt with recent trauma to ribs with subsequent fracture. Incident was work related. Is seeing workman's comp appointment MD about incident. Deferring care to [facility] comp physician.     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 No pertinent surgical history.     Home Medications    Prior to Admission medications   Medication Sig Start Date End Date Taking? Authorizing Provider  acetaminophen (TYLENOL) 500 MG tablet Take 500-1,000 mg by mouth every 6 (six) hours as needed for mild pain or headache.    [provider]  amLODipine (NORVASC) 5 MG tablet Take 1 tablet (5 mg total) by mouth daily. 08/25/20   10/26/20, PA-C  atorvastatin (LIPITOR) 10 MG  tablet Take 10 mg by mouth daily. 02/17/19   [provider]  benzonatate (TESSALON) 100 MG capsule Take 1-2 capsules (100-200 mg total) by mouth 3 (three) times daily as needed for cough. 08/25/20   10/26/20, PA-C  cetirizine (ZYRTEC ALLERGY) 10 MG tablet Take 1 tablet (10 mg total) by mouth daily. 08/25/20   10/26/20, PA-C  erythromycin ophthalmic ointment Place a 1/2 inch ribbon of ointment into the lower eyelid of right eye twice daily for 7 days 05/06/21   Raspet, Ricke Kimoto K, PA-C  fluticasone (FLONASE) 50 MCG/ACT nasal spray Place 2 sprays into both nostrils daily. 08/25/20   10/26/20, PA-C  hydrochlorothiazide (HYDRODIURIL) 12.5 MG tablet Take 12.5 mg by mouth daily. 02/17/19   [provider]  ibuprofen (ADVIL,MOTRIN) 200 MG tablet Take 400 mg by mouth every 6 (six) hours as needed for headache or mild pain.    [provider]  promethazine-dextromethorphan (PROMETHAZINE-DM) 6.25-15 MG/5ML syrup Take 5 mLs by mouth at bedtime as needed for cough. 08/25/20   10/26/20, PA-C  pseudoephedrine (SUDAFED) 30 MG tablet Take 1 tablet (30 mg total) by mouth every 8 (eight) hours as needed for congestion. 08/25/20   10/26/20, PA-C  tamsulosin (FLOMAX) 0.4 MG CAPS capsule Take 1 capsule (0.4 mg total) by mouth daily. 01/11/18   Campos,  Caryn Bee, MD  vitamin B-12 (CYANOCOBALAMIN) 1000 MCG tablet Take 1,000 mcg by mouth daily.    [provider]  vitamin C (ASCORBIC ACID) 500 MG tablet Take 500 mg by mouth daily.    [provider]  metoprolol tartrate (LOPRESSOR) 25 MG tablet Take 1 tablet (25 mg total) by mouth 2 (two) times daily. 05/03/15 04/02/19  Hongalgi, Maximino Greenland, MD  promethazine (PHENERGAN) 25 MG tablet Take 1 tablet (25 mg total) by mouth every 6 (six) hours as needed for nausea. 12/07/16 04/02/19  Fayrene Helper, PA-C    Family History Family History  Problem Relation Age of Onset   Healthy Mother    Healthy Father     Social History Social History    Tobacco Use   Smoking status: Never   Smokeless tobacco: Never  Vaping Use   Vaping Use: Never used  Substance Use Topics   Alcohol use: Not Currently   Drug use: No     Allergies   Hydralazine hcl   Review of Systems Review of Systems  Constitutional: Negative.   HENT: Negative.    Respiratory: Negative.    Cardiovascular: Negative.   Gastrointestinal: Negative.   Genitourinary: Negative.      Physical Exam Triage Vital Signs ED Triage Vitals [01/01/22 0917]  Enc Vitals Group     BP (!) 193/96     Pulse Rate 73     Resp 16     Temp 97.7 F (36.5 C)     Temp Source Oral     SpO2 97 %     Weight      Height      Head Circumference      Peak Flow      Pain Score 7     Pain Loc      Pain Edu?      Excl. in GC?    No data found.  Updated Vital Signs BP (!) 193/96 (BP Location: Right Arm)   Pulse 73   Temp 97.7 F (36.5 C) (Oral)   Resp 16   SpO2 97%   Visual Acuity Right Eye Distance:   Left Eye Distance:   Bilateral Distance:    Right Eye Near:   Left Eye Near:    Bilateral Near:     Physical Exam Constitutional:      Appearance: Normal appearance.  Cardiovascular:     Rate and Rhythm: Normal rate and regular rhythm.  Pulmonary:     Effort: Pulmonary effort is normal.     Breath sounds: Normal breath sounds.  Musculoskeletal:     Comments: Point tenderness to to the right anterior shoulder;  full rom but pain with full rotation;  normal strength  Neurological:     General: No focal deficit present.     Mental Status: He is alert.  Psychiatric:        Mood and Affect: Mood normal.      UC Treatments / Results  Labs (all labs ordered are listed, but only abnormal results are displayed) Labs Reviewed - No data to display  EKG   Radiology No results found.  Procedures Procedures (including critical care time)  Medications Ordered in UC Medications - No data to display  Initial Impression / Assessment and Plan / UC  Course  I have reviewed the triage vital signs and the nursing notes.  Pertinent labs & imaging results that were available during my care of the patient were reviewed by me and considered in  my medical decision making (see chart for details).    Final Clinical Impressions(s) / UC Diagnoses   Final diagnoses:  Hypertension, unspecified type  Acute pain of right shoulder     Discharge Instructions      You were seen today for various issues.  I have sent out a steroid to help with shoulder pain.  Take this daily with food to avoid an upset stomach.  You may use ice as well.  If this does not improve you may need to see an orthopedist.  I have refilled your two blood pressure medications.  Please follow up with your primary care provider for further care and refills of this medication.     ED Prescriptions     Medication Sig Dispense Auth. Provider   amLODipine (NORVASC) 5 MG tablet Take 1 tablet (5 mg total) by mouth daily. 30 tablet Rosene Pilling, MD   hydrochlorothiazide (HYDRODIURIL) 12.5 MG tablet Take 1 tablet (12.5 mg total) by mouth daily. 30 tablet Donyae Kohn, MD   predniSONE (DELTASONE) 20 MG tablet Take 2 tablets (40 mg total) by mouth daily for 5 days. 10 tablet Jannifer Franklin, MD      PDMP not reviewed this encounter.   Jannifer Franklin, MD 01/01/22 [phone]

## 2022-01-01 NOTE — Discharge Instructions (Signed)
You were seen today for various issues.  I have sent out a steroid to help with shoulder pain.  Take this daily with food to avoid an upset stomach.  You may use ice as well.  If this does not improve you may need to see an orthopedist.  I have refilled your two blood pressure medications.  Please follow up with your primary care provider for further care and refills of this medication.

## 2022-01-01 NOTE — ED Triage Notes (Signed)
Patient having pain in the right shoulder x 3 weeks. No previous injuries, no falls. Patient has been lifting heavy but unsure.   Patient states BP has been high. When going to donate plasma it was high as well. No headache blurred vision or dizziness. Has been off BP meds for a year.
